# Patient Record
Sex: Male | Born: 1950 | Race: White | Hispanic: No | Marital: Married | Smoking: Former smoker
Health system: Southern US, Community
[De-identification: ages and names within clinical notes are randomized; demographics above are authoritative.]

## PROBLEM LIST (undated history)

## (undated) DIAGNOSIS — E785 Hyperlipidemia, unspecified: Secondary | ICD-10-CM

## (undated) DIAGNOSIS — I69319 Unspecified symptoms and signs involving cognitive functions following cerebral infarction: Secondary | ICD-10-CM

## (undated) DIAGNOSIS — I10 Essential (primary) hypertension: Secondary | ICD-10-CM

## (undated) HISTORY — DX: Unspecified symptoms and signs involving cognitive functions following cerebral infarction: I69.319

## (undated) HISTORY — DX: Hyperlipidemia, unspecified: E78.5

## (undated) HISTORY — DX: Essential (primary) hypertension: I10

---

## 2020-03-27 DIAGNOSIS — R413 Other amnesia: Secondary | ICD-10-CM | POA: Insufficient documentation

## 2020-08-23 DIAGNOSIS — E785 Hyperlipidemia, unspecified: Secondary | ICD-10-CM | POA: Insufficient documentation

## 2020-08-23 DIAGNOSIS — I480 Paroxysmal atrial fibrillation: Secondary | ICD-10-CM | POA: Insufficient documentation

## 2020-08-23 HISTORY — DX: Paroxysmal atrial fibrillation: I48.0

## 2021-02-28 ENCOUNTER — Ambulatory Visit (INDEPENDENT_AMBULATORY_CARE_PROVIDER_SITE_OTHER): Payer: Medicare Other | Admitting: Nurse Practitioner

## 2021-02-28 ENCOUNTER — Other Ambulatory Visit: Payer: Self-pay

## 2021-02-28 ENCOUNTER — Encounter (HOSPITAL_BASED_OUTPATIENT_CLINIC_OR_DEPARTMENT_OTHER): Payer: Self-pay | Admitting: Nurse Practitioner

## 2021-02-28 VITALS — BP 135/88 | HR 77 | Ht 71.0 in | Wt 173.1 lb

## 2021-02-28 DIAGNOSIS — R413 Other amnesia: Secondary | ICD-10-CM | POA: Diagnosis not present

## 2021-02-28 DIAGNOSIS — E785 Hyperlipidemia, unspecified: Secondary | ICD-10-CM | POA: Diagnosis not present

## 2021-02-28 DIAGNOSIS — Z8673 Personal history of transient ischemic attack (TIA), and cerebral infarction without residual deficits: Secondary | ICD-10-CM | POA: Insufficient documentation

## 2021-02-28 DIAGNOSIS — E559 Vitamin D deficiency, unspecified: Secondary | ICD-10-CM | POA: Insufficient documentation

## 2021-02-28 DIAGNOSIS — I480 Paroxysmal atrial fibrillation: Secondary | ICD-10-CM

## 2021-02-28 DIAGNOSIS — Q2112 Patent foramen ovale: Secondary | ICD-10-CM

## 2021-02-28 HISTORY — DX: Personal history of transient ischemic attack (TIA), and cerebral infarction without residual deficits: Z86.73

## 2021-02-28 HISTORY — DX: Patent foramen ovale: Q21.12

## 2021-02-28 NOTE — Patient Instructions (Addendum)
Thank you for choosing [place] [facility] for your Primary Care needs. I am excited for the opportunity to partner with you to meet your health care goals. It was a pleasure meeting you today!  Recommendations from today's visit: I would like you to check your blood pressures at home at a time when you are calm and relaxed. If your BP is higher than 150/100, please let me know.  I have sent the referral to cardiology and neurology for you. If you begin to develop any concerning symptoms or questions prior to seeing them, please let us know or seek emergency evaluation if it is significant or emergent.    Information on diet, exercise, and health maintenance recommendations are listed below. This is information to help you be sure you are on track for optimal health and monitoring.   Please look over this and let us know if you have any questions or if you have completed any of the health maintenance outside of [place] so that we can be sure your records are up to date.  ___________________________________________________________ About Me: I am an Adult-Geriatric Nurse Practitioner with a background in caring for patients for more than 20 years with a strong intensive care background. I provide primary care and sports medicine services to patients age 69 and older within this office. My education had a strong focus on caring for the older adult population, which I am passionate about. I am also the director of the APP Fellowship with [facility].   My desire is to provide you with the best service through preventive medicine and supportive care. I consider you a part of the medical team and value your input. I work diligently to ensure that you are heard and your needs are met in a safe and effective manner. I want you to feel comfortable with me as your provider and want you to know that your health concerns are important to me.  For your information, our office hours  are: Monday, Tuesday, and Thursday 8:00 AM - 5:00 PM Wednesday and Friday 8:00 AM - 12:00 PM.   In my time away from the office I am teaching new APP's within the system and am unavailable, but my partner, Dr. Burnard Bunting is in the office for emergent needs.   If you have questions or concerns, please call our office at [phone] or send [place] a MyChart message and we will respond as quickly as possible.  ____________________________________________________________ MyChart:  For all urgent or time sensitive needs we ask that you please call the office to avoid delays. Our number is [phone]. MyChart is not constantly monitored and due to the large volume of messages a day, replies may take up to 72 business hours.  MyChart Policy: MyChart allows for you to see your visit notes, after visit summary, provider recommendations, lab and tests results, make an appointment, request refills, and contact your provider or the office for non-urgent questions or concerns. Providers are seeing patients during normal business hours and do not have built in time to review MyChart messages.  We ask that you allow a minimum of 3 business days for responses to [employer]. For this reason, please do not send urgent requests through [place]. Please call the office at [phone]. New and ongoing conditions may require a visit. We have virtual and in person visit available for your convenience.  Complex MyChart concerns may require a visit. Your provider may request you schedule a virtual or in  person visit to ensure we are providing the best care possible. MyChart messages sent after 11:00 AM on Friday will not be received by the provider until Monday morning.    Lab and Test Results: You will receive your lab and test results on MyChart as soon as they are completed and results have been sent by the lab or testing facility. Due to this service, you will receive your results BEFORE your provider.  I review  lab and tests results each morning prior to seeing patients. Some results require collaboration with other providers to ensure you are receiving the most appropriate care. For this reason, we ask that you please allow a minimum of 3-5 business days from the time the ALL results have been received for your provider to receive and review lab and test results and contact you about these.  Most lab and test result comments from the provider will be sent through [place]. Your provider may recommend changes to the plan of care, follow-up visits, repeat testing, ask questions, or request an office visit to discuss these results. You may reply directly to this message or call the office at [phone] to provide information for the provider or set up an appointment. In some instances, you will be called with test results and recommendations. Please let us know if this is preferred and we will make note of this in your chart to provide this for you.    If you have not heard a response to your lab or test results in 5 business days from all results returning to [place], please call the office to let us know. We ask that you please avoid calling prior to this time unless there is an emergent concern. Due to high call volumes, this can delay the resulting process.  After Hours: For all non-emergency after hours needs, please call the office at [phone] and select the option to reach the on-call provider service. On-call services are shared between multiple [place] offices and therefore it will not be possible to speak directly with your provider. On-call providers may provide medical advice and recommendations, but are unable to provide refills for maintenance medications.  For all emergency or urgent medical needs after normal business hours, we recommend that you seek care at the closest Urgent Care or Emergency Department to ensure appropriate treatment in a timely manner.  [facility]  has a 24 hour emergency room located on the ground floor for your convenience.   Urgent Concerns During the Business Day Providers are seeing patients from 8AM to [place] with a busy schedule and are most often not able to respond to non-urgent calls until the end of the day or the next business day. If you should have URGENT concerns during the day, please call and speak to the nurse or schedule a same day appointment so that we can address your concern without delay.   Thank you, again, for choosing me as your health care partner. I appreciate your trust and look forward to learning more about you.   Worthy Keeler, DNP, AGNP-c ___________________________________________________________  Health Maintenance Recommendations Screening Testing Mammogram Every 1 -2 years based on history and risk factors Starting at age 51 Pap Smear Ages 21-39 every 3 years Ages 21-65 every 5 years with HPV testing More frequent testing may be required based on results and history Colon Cancer Screening Every 1-10 years based on test performed, risk factors, and history Starting at age 67 Bone Density Screening Every 2-10 years  based on history Starting at age 7 for women Recommendations for men differ based on medication usage, history, and risk factors AAA Screening One time ultrasound Men 55-20 years old who have every smoked Lung Cancer Screening Low Dose Lung CT every 12 months Age 77-80 years with a 30 pack-year smoking history who still smoke or who have quit within the last 15 years  Screening Labs Routine  Labs: Complete Blood Count (CBC), Complete Metabolic Panel (CMP), Cholesterol (Lipid Panel) Every 6-12 months based on history and medications May be recommended more frequently based on current conditions or previous results Hemoglobin A1c Lab Every 3-12 months based on history and previous results Starting at age 106 or earlier with diagnosis of diabetes, high cholesterol, BMI >26,  and/or risk factors Frequent monitoring for patients with diabetes to ensure blood sugar control Thyroid Panel (TSH w/ T3 & T4) Every 6 months based on history, symptoms, and risk factors May be repeated more often if on medication HIV One time testing for all patients 7 and older May be repeated more frequently for patients with increased risk factors or exposure Hepatitis C One time testing for all patients 71 and older May be repeated more frequently for patients with increased risk factors or exposure Gonorrhea, Chlamydia Every 12 months for all sexually active persons 13-24 years Additional monitoring may be recommended for those who are considered high risk or who have symptoms PSA Men 44-42 years old with risk factors Additional screening may be recommended from age 72-69 based on risk factors, symptoms, and history  Vaccine Recommendations Tetanus Booster All adults every 10 years Flu Vaccine All patients 6 months and older every year COVID Vaccine All patients 12 years and older Initial dosing with booster May recommend additional booster based on age and health history HPV Vaccine 2 doses all patients age 1-26 Dosing may be considered for patients over 26 Shingles Vaccine (Shingrix) 2 doses all adults 84 years and older Pneumonia (Pneumovax 23) All adults 45 years and older May recommend earlier dosing based on health history Pneumonia (Prevnar 78) All adults 78 years and older Dosed 1 year after Pneumovax 23  Additional Screening, Testing, and Vaccinations may be recommended on an individualized basis based on family history, health history, risk factors, and/or exposure.  __________________________________________________________  Diet Recommendations for All Patients  I recommend that all patients maintain a diet low in saturated fats, carbohydrates, and cholesterol. While this can be challenging at first, it is not impossible and small changes can make big  differences.  Things to try: Decreasing the amount of soda, sweet tea, and/or juice to one or less per day and replace with water While water is always the first choice, if you do not like water you may consider adding a water additive without sugar to improve the taste other sugar free drinks Replace potatoes with a brightly colored vegetable at dinner Use healthy oils, such as canola oil or olive oil, instead of butter or hard margarine Limit your bread intake to two pieces or less a day Replace regular pasta with low carb pasta options Bake, broil, or grill foods instead of frying Monitor portion sizes  Eat smaller, more frequent meals throughout the day instead of large meals  An important thing to remember is, if you love foods that are not great for your health, you don't have to give them up completely. Instead, allow these foods to be a reward when you have done well. Allowing yourself to still have special treats  every once in a while is a nice way to tell yourself thank you for working hard to keep yourself healthy.   Also remember that every day is a new day. If you have a bad day and "fall off the wagon", you can still climb right back up and keep moving along on your journey!  We have resources available to help you!  Some websites that may be helpful include: www.http://carter.biz/  Www.VeryWellFit.com _____________________________________________________________  Activity Recommendations for All Patients  I recommend that all adults get at least 20 minutes of moderate physical activity that elevates your heart rate at least 5 days out of the week.  Some examples include: Walking or jogging at a pace that allows you to carry on a conversation Cycling (stationary bike or outdoors) Water aerobics Yoga Weight lifting Dancing If physical limitations prevent you from putting stress on your joints, exercise in a pool or seated in a chair are excellent options.  Do determine your  MAXIMUM heart rate for activity: YOUR AGE - 220 = MAX HeartRate   Remember! Do not push yourself too hard.  Start slowly and build up your pace, speed, weight, time in exercise, etc.  Allow your body to rest between exercise and get good sleep. You will need more water than normal when you are exerting yourself. Do not wait until you are thirsty to drink. Drink with a purpose of getting in at least 8, 8 ounce glasses of water a day plus more depending on how much you exercise and sweat.    If you begin to develop dizziness, chest pain, abdominal pain, jaw pain, shortness of breath, headache, vision changes, lightheadedness, or other concerning symptoms, stop the activity and allow your body to rest. If your symptoms are severe, seek emergency evaluation immediately. If your symptoms are concerning, but not severe, please let us know so that we can recommend further evaluation.

## 2021-02-28 NOTE — Progress Notes (Signed)
Tollie Eth, DNP, AGNP-c Primary Care & Sports Medicine [address]   [address] [place], [place] [address] [phone]  New patient visit   Patient: Steven Oliver   DOB: 1950/02/01   70 y.o. Male  MRN: [id number] Visit Date: 02/28/2021  Patient Care Team: Konstantina Nachreiner, Sung Amabile, NP as PCP - General (Nurse Practitioner)  Today's healthcare provider: Tollie Eth, NP   Chief Complaint  Patient presents with   New Patient (Initial Visit)    Patient presents today to establish care. He is new to area, moved here 2 months ago. History of strokes. He would like labs done today. A referral to Neurologist and Cardiologist. No refills needed today.    Subjective    Steven Oliver is a 71 y.o. male who presents today as a new patient to establish care.    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 They found at that time that he had experienced several TIAs prior.  He has no residual weakness however is experiencing mental status changes including short-term memory loss.  He and his wife do express concern today that there may be an element of dementia present.  These changes [place] be closer to family for help.  They have been here approximately 2 months.  He would like a referral to Dr. Everlena Cooper with [facility] neurology today.  Patient's wife reports that he has a history of whitecoat hypertension.  His blood pressures run at home in the 120s over 90s most days.  He denies any chest pain, palpitations, dizziness, weakness.  He has no lower extremity edema.  He was followed by cardiology at his previous home and would like a referral today.  They report he does have a history of PFO, A-fib, and hyperlipidemia.  He is chronically anticoagulated and denies any current signs of bleeding or easy bruising.  He currently lives at home with his wife.  He has 2 adult  sons.  He reports he is safe in his current environment and relationship.  He endorses wine socially, he has never used recreational drugs, he has approximately 1 cigarette a week.   History reviewed and reveals the following: Past Medical History:  Diagnosis Date   CVA, old, cognitive deficits    Hyperlipidemia    Hypertension    History reviewed. No pertinent surgical history. Family Status  Relation Name Status   Mother  (Not Specified)   Mat Aunt  (Not Specified)   Family History  Problem Relation Age of Onset   Alzheimer's disease Mother    Alzheimer's disease Maternal Aunt    Social History   Socioeconomic History   Marital status: Married    Spouse name: [name]   Number of children: 2   Years of education: 12   Highest education level: Not on file  Occupational History   Not on file  Tobacco Use   Smoking status: Some Days    Types: Cigarettes   Smokeless tobacco: Never  Vaping Use   Vaping Use: Never used  Substance and Sexual Activity   Alcohol use: Yes    Alcohol/week: 1.0 standard drink    Types: 1 Glasses of wine per week   Drug use: Never   Sexual activity: Not Currently    Partners: Female  Other Topics Concern   Not on file  Social History Narrative   Not  on file   Social Determinants of Health   Financial Resource Strain: Not on file  Food Insecurity: Not on file  Transportation Needs: Not on file  Physical Activity: Not on file  Stress: Not on file  Social Connections: Not on file   Outpatient Medications Prior to Visit  Medication Sig   apixaban (ELIQUIS) 5 MG TABS tablet Take 1 tablet by mouth 2 (two) times daily.   atorvastatin (LIPITOR) 40 MG tablet Take 40 mg by mouth daily.   cholecalciferol (VITAMIN D) 25 MCG (1000 UNIT) tablet Take by mouth.   Multiple Vitamins-Minerals (CENTRUM SILVER PO) Take by mouth.   XIIDRA 5 % SOLN Apply 1 drop to eye 2 (two) times daily.   No facility-administered medications prior to visit.   Not on  File Immunization History  Administered Date(s) Administered   Influenza-Unspecified 09/13/2020    Health Maintenance Due: Health Maintenance  Topic Date Due   COVID-19 Vaccine (1) Never done   Pneumonia Vaccine 35+ Years old (1 - PCV) Never done   Hepatitis C Screening  Never done   TETANUS/TDAP  Never done   COLONOSCOPY (Pts 45-47yrs Insurance coverage will need to be confirmed)  Never done   Zoster Vaccines- Shingrix (1 of 2) Never done   INFLUENZA VACCINE  Completed   HPV VACCINES  Aged Out    Review of Systems All review of systems negative except what is listed in the HPI   Objective    BP 135/88    Pulse 77    Ht 5\' 11"  (1.803 m)    Wt 173 lb 1.6 oz (78.5 kg)    SpO2 95%    BMI 24.14 kg/m  Physical Exam Vitals and nursing note reviewed.  Constitutional:      Appearance: Normal appearance.  HENT:     Head: Normocephalic.  Eyes:     Extraocular Movements: Extraocular movements intact.     Conjunctiva/sclera: Conjunctivae normal.     Pupils: Pupils are equal, round, and reactive to light.  Neck:     Vascular: No carotid bruit.  Cardiovascular:     Rate and Rhythm: Normal rate and regular rhythm.     Pulses: Normal pulses.     Heart sounds: Murmur heard.  Pulmonary:     Effort: Pulmonary effort is normal.     Breath sounds: Normal breath sounds. No wheezing.  Abdominal:     General: Abdomen is flat. Bowel sounds are normal. There is no distension.     Palpations: Abdomen is soft.     Tenderness: There is no abdominal tenderness. There is no right CVA tenderness, left CVA tenderness or guarding.  Musculoskeletal:        General: Normal range of motion.     Cervical back: Normal range of motion.     Right lower leg: No edema.     Left lower leg: No edema.  Lymphadenopathy:     Cervical: No cervical adenopathy.  Skin:    General: Skin is warm and dry.     Capillary Refill: Capillary refill takes less than 2 seconds.  Neurological:     General: No focal  deficit present.     Mental Status: He is alert and oriented to person, place, and time.     Cranial Nerves: No cranial nerve deficit.     Sensory: No sensory deficit.     Motor: No weakness.     Gait: Gait normal.     Deep Tendon Reflexes: Reflexes normal.  Psychiatric:        Mood and Affect: Mood normal.        Behavior: Behavior normal.        Thought Content: Thought content normal.        Judgment: Judgment normal.    No results found for any visits on 02/28/21.  Assessment & Plan      Problem List Items Addressed This Visit     Dyslipidemia - Primary    Chronic.  Currently taking atorvastatin 40 mg once a day. We will obtain labs today for evaluation and make recommendations to changes to plan of care as necessary based on findings.      Relevant Medications   atorvastatin (LIPITOR) 40 MG tablet   Other Relevant Orders   Ambulatory referral to Cardiology   CBC with Differential/Platelet   Comprehensive metabolic panel   Lipid panel   H/O: stroke    History of embolic stroke with short-term memory deficits present.  Next time no weakness or alarm symptoms present today.  Patient is chronically anticoagulated on Eliquis 5 mg twice a day. History of A-fib and PFO likely contributing to CVA history. We will send referral to neurology today for recommendations and monitoring.      Relevant Orders   Ambulatory referral to Cardiology   CBC with Differential/Platelet   Comprehensive metabolic panel   Lipid panel   Ambulatory referral to Neurology   Memory problem    Short-term memory difficulties following CVA. Unclear at this time if additional factors such as mild dementia are also presenting.  Patient's wife is concerned that this could be the case. Recommend evaluation with neurology and memory testing for further evaluation. We will continue to follow.      Relevant Orders   CBC with Differential/Platelet   Comprehensive metabolic panel   Lipid panel    VITAMIN D 25 Hydroxy (Vit-D Deficiency, Fractures)   B12 and Folate Panel   Ambulatory referral to Neurology   PAF (paroxysmal atrial fibrillation) (HCC)    Heart rate and rhythm regular on evaluation today. Patient is currently chronically anticoagulated with Eliquis 5 mg twice a day No alarm symptoms present at this time.  Will collaborate with cardiology.      Relevant Medications   apixaban (ELIQUIS) 5 MG TABS tablet   atorvastatin (LIPITOR) 40 MG tablet   Other Relevant Orders   Ambulatory referral to Cardiology   CBC with Differential/Platelet   Comprehensive metabolic panel   Lipid panel   PFO (patent foramen ovale)    PFO noted with history of paroxysmal A-fib and previous stroke. Patient is chronically anticoagulated on Eliquis 5 mg twice daily. Murmur is detected on auscultation today. No alarm symptoms present at this time. We will send referral to cardiology for further evaluation and recommendations.      Relevant Medications   apixaban (ELIQUIS) 5 MG TABS tablet   atorvastatin (LIPITOR) 40 MG tablet   Other Relevant Orders   Ambulatory referral to Cardiology   CBC with Differential/Platelet   Comprehensive metabolic panel   Lipid panel   Vitamin D deficiency    History of vitamin D deficiency.  Patient is currently taking over-the-counter supplementation. We will obtain labs today for further evaluation.      Relevant Orders   VITAMIN D 25 Hydroxy (Vit-D Deficiency, Fractures)     Return in about 3 months (around 05/28/2021) for F/U HTN.     Ilya Neely, Sung AmabileSara E, NP, DNP, AGNP-C Primary Care & Sports Medicine  at [facility]

## 2021-03-01 ENCOUNTER — Encounter (HOSPITAL_BASED_OUTPATIENT_CLINIC_OR_DEPARTMENT_OTHER): Payer: Self-pay | Admitting: Nurse Practitioner

## 2021-03-01 NOTE — Assessment & Plan Note (Signed)
PFO noted with history of paroxysmal A-fib and previous stroke. Patient is chronically anticoagulated on Eliquis 5 mg twice daily. Murmur is detected on auscultation today. No alarm symptoms present at this time. We will send referral to cardiology for further evaluation and recommendations.

## 2021-03-01 NOTE — Assessment & Plan Note (Signed)
History of vitamin D deficiency.  Patient is currently taking over-the-counter supplementation. We will obtain labs today for further evaluation.

## 2021-03-01 NOTE — Assessment & Plan Note (Signed)
Chronic.  Currently taking atorvastatin 40 mg once a day. We will obtain labs today for evaluation and make recommendations to changes to plan of care as necessary based on findings.

## 2021-03-01 NOTE — Assessment & Plan Note (Signed)
Short-term memory difficulties following CVA. Unclear at this time if additional factors such as mild dementia are also presenting.  Patient's wife is concerned that this could be the case. Recommend evaluation with neurology and memory testing for further evaluation. We will continue to follow.

## 2021-03-01 NOTE — Assessment & Plan Note (Signed)
Heart rate and rhythm regular on evaluation today. Patient is currently chronically anticoagulated with Eliquis 5 mg twice a day No alarm symptoms present at this time.  Will collaborate with cardiology.

## 2021-03-01 NOTE — Assessment & Plan Note (Signed)
History of embolic stroke with short-term memory deficits present.  Next time no weakness or alarm symptoms present today.  Patient is chronically anticoagulated on Eliquis 5 mg twice a day. History of A-fib and PFO likely contributing to CVA history. We will send referral to neurology today for recommendations and monitoring.

## 2021-03-04 ENCOUNTER — Encounter: Payer: Self-pay | Admitting: Physician Assistant

## 2021-03-04 LAB — COMPREHENSIVE METABOLIC PANEL
ALT: 13 IU/L (ref 0–44)
AST: 13 IU/L (ref 0–40)
Albumin/Globulin Ratio: 1.9 (ref 1.2–2.2)
Albumin: 4.7 g/dL (ref 3.8–4.8)
Alkaline Phosphatase: 92 IU/L (ref 44–121)
BUN/Creatinine Ratio: 15 (ref 10–24)
BUN: 15 mg/dL (ref 8–27)
Bilirubin Total: 0.5 mg/dL (ref 0.0–1.2)
CO2: 26 mmol/L (ref 20–29)
Calcium: 10.4 mg/dL — ABNORMAL HIGH (ref 8.6–10.2)
Chloride: 101 mmol/L (ref 96–106)
Creatinine, Ser: 1.02 mg/dL (ref 0.76–1.27)
Globulin, Total: 2.5 g/dL (ref 1.5–4.5)
Glucose: 102 mg/dL — ABNORMAL HIGH (ref 70–99)
Potassium: 4.5 mmol/L (ref 3.5–5.2)
Sodium: 141 mmol/L (ref 134–144)
Total Protein: 7.2 g/dL (ref 6.0–8.5)
eGFR: 79 mL/min/{1.73_m2} (ref >59)

## 2021-03-04 LAB — LIPID PANEL
Chol/HDL Ratio: 2.6 ratio (ref 0.0–5.0)
Cholesterol, Total: 167 mg/dL (ref 100–199)
HDL: 65 mg/dL (ref >39)
LDL Chol Calc (NIH): 86 mg/dL (ref 0–99)
Triglycerides: 84 mg/dL (ref 0–149)
VLDL Cholesterol Cal: 16 mg/dL (ref 5–40)

## 2021-03-04 LAB — B12 AND FOLATE PANEL
Folate: 9.5 ng/mL (ref >3.0)
Vitamin B-12: 556 pg/mL (ref 232–1245)

## 2021-03-04 LAB — CBC WITH DIFFERENTIAL/PLATELET
Basophils Absolute: 0.1 10*3/uL (ref 0.0–0.2)
Basos: 1 %
EOS (ABSOLUTE): 0.2 10*3/uL (ref 0.0–0.4)
Eos: 2 %
Hematocrit: 45.8 % (ref 37.5–51.0)
Hemoglobin: 15 g/dL (ref 13.0–17.7)
Immature Grans (Abs): 0 10*3/uL (ref 0.0–0.1)
Immature Granulocytes: 0 %
Lymphocytes Absolute: 1.6 10*3/uL (ref 0.7–3.1)
Lymphs: 20 %
MCH: 29.8 pg (ref 26.6–33.0)
MCHC: 32.8 g/dL (ref 31.5–35.7)
MCV: 91 fL (ref 79–97)
Monocytes Absolute: 0.7 10*3/uL (ref 0.1–0.9)
Monocytes: 9 %
Neutrophils Absolute: 5.8 10*3/uL (ref 1.4–7.0)
Neutrophils: 68 %
Platelets: 249 10*3/uL (ref 150–450)
RBC: 5.03 x10E6/uL (ref 4.14–5.80)
RDW: 12.8 % (ref 11.6–15.4)
WBC: 8.4 10*3/uL (ref 3.4–10.8)

## 2021-03-04 LAB — VITAMIN D 25 HYDROXY (VIT D DEFICIENCY, FRACTURES): Vit D, 25-Hydroxy: 32.5 ng/mL (ref 30.0–100.0)

## 2021-03-11 NOTE — Progress Notes (Signed)
Assessment/Plan:   Steven Oliver is a very pleasant 71 y.o. year old RH male with  a history of "white coat "hypertension, hyperlipidemia, remote CVA, atrial fibrillation on Eliquis , history of PFO seen today for evaluation of memory loss.  And to establish care as the patient recently moved from [facility].  MoCA today is 4/30 with deficiencies in most area.  Over the last year, daily dementia has been progressing at a more accelerated rate, with significant cognitive decline.  In the past, he had been seen first in [place], reportedly it was felt that his cognitive issues were due to the stroke.  Once moving to [facility], he had initial evaluation at a different facility, was to undergo neuropsychological evaluation, but wife wanted to relocate to the [place] area, to be near her family, to be able to care for him with the help of the close ones.    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  CBC, CMP, TSH, B12, RPR, ammonia, ESR, CRP, ANA, HIV MRI brain with/without contrast to assess for underlying structural abnormality and assess vascular load  LP for CSF studies, including 14 3, as well as total protein Neurocognitive testing to evaluate cognitive concerns and determine other underlying causes of memory changes, including potential contribution from sleep, anxiety or depression. Check TSH, B12, CBC, CMP, RPR, ammonia, sed rate, CRP, ANA and HIV Discussed safety both in and out of the home.  Discussed the importance of regular daily schedule to maintain brain function.  Continue to monitor mood with PCP.  Stay active at least 30 minutes at least 3 times a week.  Naps should be scheduled and should be no longer than 60 minutes and should not occur after 2 PM.   Start Memantine 10 mg: Take 1 tablet (10 mg at night) for 2 weeks, then increase to 1 tablet (10 mg) twice a day   Control  cardiovascular risk factors  Mediterranean diet is recommended  Folllow up in 2 months and after neurocognitve testing    Subjective:   The patient is seen in neurologic consultation at the request of Early, Coralee Pesa, NP for the evaluation of memory.  The patient is accompanied by his wife who supplements the history.a pleasant 71 year old man who recently relocated to this area to establish care in view of the diagnosis of dementia.   About 3 years ago, the patient suffered a CVA while in [place], found at the time that he had experienced several TIAs prior, without residual weaknessIitially he was "doing well 1 year after his CVA, except for some issues with his short-term memory, then beginning to show decline ".  In review, back in March of last year, his wife said he did not recognize her "several episodes like that"  "Most of the time he is fairly okay, but they are increasing moments of disorientation ".Sometimes he does not recognize the house that he is in, thinking he is [place] the [place] in [place] prior home.  He is also unable to do some home repairs that he used to do before. 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  Sometimes he asked his wife "where is my wife?  ".His confusion may last most of the day.  Recently, he was going to take his dog for a walk, but he only took the leash with him, "and forgot the dog ".  He also does not know how to use the fork properly anymore.  There are some hygiene concerns, such as not wishing to take a shower.  He asked for "can I take it another time? ".The morning of this visit, he was needing help to dress up, he forgot how to put the belt on. He leaves objects in different places, for example not realizing after eating ice cream to put it back in the freezer, and instead only putting the lid. He ambulates without difficulty, denies any recent falls or head injuries. He sleeps fairly well unless he has to get up to go  to the bathroom.  His wife is in charge of the medications, as well as finances (she was a Customer service manager before).  He currently denies any headaches, double vision, dizziness, focal numbness or tingling, unilateral weakness, tremors or anosmia or seizures.  Denies urine incontinence, retention, constipation or diarrhea.  Denies sleep apnea, alcohol or tobacco.  Family history significant for mother with Alzheimer's disease, he has sisters and uncles with dementia.headaches, double vision, dizziness, focal numbness or tingling, unilateral weakness, tremors or anosmia. No history of seizures. Denies urine incontinence, retention, constipation or diarrhea.  Denies OSA, ETOH or Tobacco.       No Known Allergies  Current Outpatient Medications  Medication Instructions   apixaban (ELIQUIS) 5 MG TABS tablet 1 tablet, Oral, 2 times daily   atorvastatin (LIPITOR) 40 mg, Oral, Daily   cholecalciferol (VITAMIN D) 25 MCG (1000 UNIT) tablet Oral   memantine (NAMENDA) 10 mg, Oral, 2 times daily   Multiple Vitamins-Minerals (CENTRUM SILVER PO) Oral   XIIDRA 5 % SOLN 1 drop, Ophthalmic, 2 times daily     VITALS:   Vitals:   03/12/21 1318  BP: (!) 153/91  Pulse: 96  Resp: 18  SpO2: 97%  Weight: 171 lb (77.6 kg)  Height: 5' 11" (1.803 m)   Depression screen [facility] 2/9 03/01/2021  Decreased Interest 1  Down, Depressed, Hopeless 1  PHQ - 2 Score 2  Altered sleeping 0  Tired, decreased energy 0  Change in appetite 1  Feeling bad or failure about yourself  2  Trouble concentrating 1  Moving slowly or fidgety/restless 1  Suicidal thoughts 0  PHQ-9 Score 7  Difficult doing work/chores Somewhat difficult    PHYSICAL EXAM   HEENT:  Normocephalic, atraumatic. The mucous membranes are moist. The superficial temporal arteries are without ropiness or tenderness. Cardiovascular: Regular rate and rhythm. Lungs: Clear to auscultation bilaterally. Neck: There are no carotid bruits noted  bilaterally.  NEUROLOGICAL: Montreal Cognitive Assessment  03/13/2021  Visuospatial/ Executive (0/5) 0  Naming (0/3) 1  Attention: Read list of digits (0/2) 2  Attention: Read list of letters (0/1) 0  Attention: Serial 7 subtraction starting at 100 (0/3) 0  Language: Repeat phrase (0/2) 0  Language : Fluency (0/1) 0  Abstraction (0/2) 0  Delayed Recall (0/5) 0  Orientation (0/6) 1  Total 4  Adjusted Score (based on education) 4   No flowsheet data found.  No flowsheet data found.   Orientation:  Alert and oriented to person, not to place or time. No aphasia or dysarthria. Fund of knowledge is reduced. Recent and remote memory are reduced .  Attention and concentration are reduced  Able to name objects 1/3  and repeat phrases 0 . Delayed recall  0/5  Cranial nerves: There is good facial symmetry. Extraocular muscles are intact and visual fields are full to confrontational testing. Speech is fluent  and clear. Soft palate rises symmetrically and there is no tongue deviation. Hearing is intact to conversational tone. Tone: Tone is good throughout. Sensation: Sensation is intact to light touch and pinprick throughout. Vibration is intact at the bilateral big toe.There is no extinction with double simultaneous stimulation. There is no sensory dermatomal level identified. Coordination: The patient has no difficulty with RAM's or FNF bilaterally. Normal finger to nose  Motor: Strength is 5/5 in the bilateral upper and lower extremities. There is no pronator drift. There are no fasciculations noted. DTR's: Deep tendon reflexes are 2/4 at the bilateral biceps, triceps, brachioradialis, patella and achilles.  Plantar responses are downgoing bilaterally. Gait and Station: The patient is able to ambulate without difficulty.The patient is able to heel toe walk without any difficulty.The patient is able to ambulate in a tandem fashion. The patient is able to stand in the Romberg position.     Thank you  for allowing [place] the opportunity to participate in the care of this nice patient. Please do not hesitate to contact us for any questions or concerns.   Total time spent on today's visit was 60 minutes, including both face-to-face time and nonface-to-face time.  Time included that spent on review of records (prior notes available to me/labs/imaging if pertinent), discussing treatment and goals, answering patient's questions and coordinating care.  Cc:  Orma Render, NP  Sharene Butters 03/13/2021 9:05 PM

## 2021-03-12 ENCOUNTER — Other Ambulatory Visit: Payer: Self-pay

## 2021-03-12 ENCOUNTER — Ambulatory Visit (INDEPENDENT_AMBULATORY_CARE_PROVIDER_SITE_OTHER): Payer: Medicare Other | Admitting: Physician Assistant

## 2021-03-12 ENCOUNTER — Encounter: Payer: Self-pay | Admitting: Physician Assistant

## 2021-03-12 VITALS — BP 153/91 | HR 96 | Resp 18 | Ht 71.0 in | Wt 171.0 lb

## 2021-03-12 DIAGNOSIS — R413 Other amnesia: Secondary | ICD-10-CM

## 2021-03-12 DIAGNOSIS — F01C0 Vascular dementia, severe, without behavioral disturbance, psychotic disturbance, mood disturbance, and anxiety: Secondary | ICD-10-CM | POA: Diagnosis not present

## 2021-03-12 MED ORDER — MEMANTINE HCL 10 MG PO TABS: 10.0000 mg | ORAL_TABLET | Freq: Two times a day (BID) | ORAL | 11 refills | Status: DC

## 2021-03-12 NOTE — Patient Instructions (Addendum)
It was a pleasure to see you today at our office.   Recommendations:  Meds: Follow up in 2 months MRI brain with and without contrast Bloodwork  today Lumbar puncture  Neurocongintive exam  Start Memantine 10mg  tablets.  Take 1 tablet at bedtime for 2 weeks, then 1 tablet twice daily.   Side effects include dizziness, headache, diarrhea or constipation.  Call with any questions or concerns.    RECOMMENDATIONS FOR ALL PATIENTS WITH MEMORY PROBLEMS: 1. Continue to exercise (Recommend 30 minutes of walking everyday, or 3 hours every week) 2. Increase social interactions - continue going to [place] and enjoy social gatherings with friends and family 3. Eat healthy, avoid fried foods and eat more fruits and vegetables 4. Maintain adequate blood pressure, blood sugar, and blood cholesterol level. Reducing the risk of stroke and cardiovascular disease also helps promoting better memory. 5. Avoid stressful situations. Live a simple life and avoid aggravations. Organize your time and prepare for the next day in anticipation. 6. Sleep well, avoid any interruptions of sleep and avoid any distractions in the bedroom that may interfere with adequate sleep quality 7. Avoid sugar, avoid sweets as there is a strong link between excessive sugar intake, diabetes, and cognitive impairment We discussed the Mediterranean diet, which has been shown to help patients reduce the risk of progressive memory disorders and reduces cardiovascular risk. This includes eating fish, eat fruits and green leafy vegetables, nuts like almonds and hazelnuts, walnuts, and also use olive oil. Avoid fast foods and fried foods as much as possible. Avoid sweets and sugar as sugar use has been linked to worsening of memory function.  There is always a concern of gradual progression of memory problems. If this is the case, then we may need to adjust level of care according to patient needs. Support, both to the patient and caregiver,  should then be put into place.    The Alzheimers Association is here all day, every day for people facing Alzheimers disease through our free 24/7 Helpline: [phone]. The Helpline provides reliable information and support to all those who need assistance, such as individuals living with memory loss, Alzheimer's or other dementia, caregivers, health care professionals and the public.  Our highly trained and knowledgeable staff can help you with: Understanding memory loss, dementia and Alzheimer's  Medications and other treatment options  General information about aging and brain health  Skills to provide quality care and to find the best care from professionals  Legal, financial and living-arrangement decisions Our Helpline also features: Confidential care consultation provided by master's level clinicians who can help with decision-making support, crisis assistance and education on issues families face every day  Help in a caller's preferred language using our translation service that features more than 200 languages and dialects  Referrals to local community programs, services and ongoing support     FALL PRECAUTIONS: Be cautious when walking. Scan the area for obstacles that may increase the risk of trips and falls. When getting up in the mornings, sit up at the edge of the bed for a few minutes before getting out of bed. Consider elevating the bed at the head end to avoid drop of blood pressure when getting up. Walk always in a well-lit room (use night lights in the walls). Avoid area rugs or power cords from appliances in the middle of the walkways. Use a walker or a cane if necessary and consider physical therapy for balance exercise. Get your eyesight checked regularly.  FINANCIAL OVERSIGHT:  Supervision, especially oversight when making financial decisions or transactions is also recommended.  HOME SAFETY: Consider the safety of the kitchen when operating appliances like stoves,  microwave oven, and blender. Consider having supervision and share cooking responsibilities until no longer able to participate in those. Accidents with firearms and other hazards in the house should be identified and addressed as well.   ABILITY TO BE LEFT ALONE: If patient is unable to contact 911 operator, consider using LifeLine, or when the need is there, arrange for someone to stay with patients. Smoking is a fire hazard, consider supervision or cessation. Risk of wandering should be assessed by caregiver and if detected at any point, supervision and safe proof recommendations should be instituted.  MEDICATION SUPERVISION: Inability to self-administer medication needs to be constantly addressed. Implement a mechanism to ensure safe administration of the medications.   DRIVING: Regarding driving, in patients with progressive memory problems, driving will be impaired. We advise to have someone else do the driving if trouble finding directions or if minor accidents are reported. Independent driving assessment is available to determine safety of driving.   If you are interested in the driving assessment, you can contact the following:  [employer] in [place]  [place] [phone] or [phone]      [place] refers to food and lifestyle choices that are based on the traditions of countries located on the [employer]. This way of eating has been shown to help prevent certain conditions and improve outcomes for people who have chronic diseases, like kidney disease and heart disease. What are tips for following this plan? Lifestyle  Cook and eat meals together with your family, when possible. Drink enough fluid to keep your urine clear or pale yellow. Be physically active every day. This includes: Aerobic exercise like running or  swimming. Leisure activities like gardening, walking, or housework. Get 7-8 hours of sleep each night. If recommended by your health care provider, drink red wine in moderation. This means 1 glass a day for nonpregnant women and 2 glasses a day for men. A glass of wine equals 5 oz (150 mL). Reading food labels  Check the serving size of packaged foods. For foods such as rice and pasta, the serving size refers to the amount of cooked product, not dry. Check the total fat in packaged foods. Avoid foods that have saturated fat or trans fats. Check the ingredients list for added sugars, such as corn syrup. Shopping  At the grocery store, buy most of your food from the areas near the walls of the store. This includes: Fresh fruits and vegetables (produce). Grains, beans, nuts, and seeds. Some of these may be available in unpackaged forms or large amounts (in bulk). Fresh seafood. Poultry and eggs. Low-fat dairy products. Buy whole ingredients instead of prepackaged foods. Buy fresh fruits and vegetables in-season from local farmers markets. Buy frozen fruits and vegetables in resealable bags. If you do not have access to quality fresh seafood, buy precooked frozen shrimp or canned fish, such as tuna, salmon, or sardines. Buy small amounts of raw or cooked vegetables, salads, or olives from the deli or salad bar at your store. Stock your pantry so you always have certain foods on hand, such as olive oil, canned tuna, canned tomatoes, rice, pasta, and beans. Cooking  Cook foods with extra-virgin olive oil instead of using butter or other vegetable oils. Have meat  as a side dish, and have vegetables or grains as your main dish. This means having meat in small portions or adding small amounts of meat to foods like pasta or stew. Use beans or vegetables instead of meat in common dishes like chili or lasagna. Experiment with different cooking methods. Try roasting or broiling vegetables instead of  steaming or sauteing them. Add frozen vegetables to soups, stews, pasta, or rice. Add nuts or seeds for added healthy fat at each meal. You can add these to yogurt, salads, or vegetable dishes. Marinate fish or vegetables using olive oil, lemon juice, garlic, and fresh herbs. Meal planning  Plan to eat 1 vegetarian meal one day each week. Try to work up to 2 vegetarian meals, if possible. Eat seafood 2 or more times a week. Have healthy snacks readily available, such as: Vegetable sticks with hummus. Greek yogurt. Fruit and nut trail mix. Eat balanced meals throughout the week. This includes: Fruit: 2-3 servings a day Vegetables: 4-5 servings a day Low-fat dairy: 2 servings a day Fish, poultry, or lean meat: 1 serving a day Beans and legumes: 2 or more servings a week Nuts and seeds: 1-2 servings a day Whole grains: 6-8 servings a day Extra-virgin olive oil: 3-4 servings a day Limit red meat and sweets to only a few servings a month What are my food choices? Mediterranean diet Recommended Grains: Whole-grain pasta. Brown rice. Bulgar wheat. Polenta. Couscous. Whole-wheat bread. [place]. Vegetables: Artichokes. Beets. Broccoli. Cabbage. Carrots. Eggplant. Green beans. Chard. Kale. Spinach. Onions. Leeks. Peas. Squash. Tomatoes. Peppers. Radishes. Fruits: Apples. Apricots. Avocado. Berries. Bananas. Cherries. Dates. Figs. Grapes. Lemons. Melon. Oranges. Peaches. Plums. Pomegranate. Meats and other protein foods: Beans. Almonds. Sunflower seeds. Pine nuts. Peanuts. [place]. Salmon. Scallops. Shrimp. [place]. Tilapia. Clams. Oysters. Eggs. Dairy: Low-fat milk. Cheese. Greek yogurt. Beverages: Water. Red wine. Herbal tea. Fats and oils: Extra virgin olive oil. Avocado oil. Grape seed oil. Sweets and desserts: [place] yogurt with honey. Baked apples. Poached pears. Trail mix. Seasoning and other foods: Basil. Cilantro. Coriander. Cumin. Mint. Parsley. Sage. Rosemary. Tarragon. Garlic.  Oregano. Thyme. Pepper. Balsalmic vinegar. Tahini. Hummus. Tomato sauce. Olives. Mushrooms. Limit these Grains: Prepackaged pasta or rice dishes. Prepackaged cereal with added sugar. Vegetables: Deep fried potatoes (french fries). Fruits: Fruit canned in syrup. Meats and other protein foods: Beef. Pork. Lamb. Poultry with skin. Hot dogs. [place] Salines. Dairy: Ice cream. Sour cream. Whole milk. Beverages: Juice. Sugar-sweetened soft drinks. Beer. Liquor and spirits. Fats and oils: Butter. Canola oil. Vegetable oil. Beef fat (tallow). Lard. Sweets and desserts: Cookies. Cakes. Pies. Candy. Seasoning and other foods: Mayonnaise. Premade sauces and marinades. The items listed may not be a complete list. Talk with your dietitian about what dietary choices are right for you. Summary The Mediterranean diet includes both food and lifestyle choices. Eat a variety of fresh fruits and vegetables, beans, nuts, seeds, and whole grains. Limit the amount of red meat and sweets that you eat. Talk with your health care provider about whether it is safe for you to drink red wine in moderation. This means 1 glass a day for nonpregnant women and 2 glasses a day for men. A glass of wine equals 5 oz (150 mL). This information is not intended to replace advice given to you by your health care provider. Make sure you discuss any questions you have with your health care provider. Document Released: 08/23/2015 Document Revised: 09/25/2015 Document Reviewed: 08/23/2015 Elsevier Interactive Patient Education  2017 [employer].  We have  sent a referral to [facility] for your MRI and they will call you directly to schedule your appointment. They are located at [place]. If you need to contact them directly please call [phone].  Lumbar Puncture will be ordered and they will contact you as well. Thank you

## 2021-03-13 DIAGNOSIS — F01C0 Vascular dementia, severe, without behavioral disturbance, psychotic disturbance, mood disturbance, and anxiety: Secondary | ICD-10-CM | POA: Insufficient documentation

## 2021-03-15 ENCOUNTER — Other Ambulatory Visit: Payer: Self-pay | Admitting: Physician Assistant

## 2021-03-15 ENCOUNTER — Ambulatory Visit
Admission: RE | Admit: 2021-03-15 | Discharge: 2021-03-15 | Disposition: A | Discharge status: Home / Self Care | Payer: Medicare Other | Source: Ambulatory Visit | Attending: Physician Assistant | Admitting: Physician Assistant

## 2021-03-15 ENCOUNTER — Telehealth: Payer: Self-pay | Admitting: Physician Assistant

## 2021-03-15 ENCOUNTER — Other Ambulatory Visit: Payer: Self-pay

## 2021-03-15 MED ORDER — GADOBENATE DIMEGLUMINE 529 MG/ML IV SOLN: 15.0000 mL | Freq: Once | INTRAVENOUS | Status: DC | PRN

## 2021-03-15 NOTE — Telephone Encounter (Signed)
Patient was supposed to have an MRI today but panicked. He will need it resch and also needs something to calm him ?

## 2021-03-20 ENCOUNTER — Telehealth: Payer: Self-pay | Admitting: Physician Assistant

## 2021-03-20 NOTE — Telephone Encounter (Signed)
Patients wife is calling about [facility]'s results ?

## 2021-03-20 NOTE — Telephone Encounter (Signed)
Patient needs a sedative sent in, unable to do MRI, will need to rescedule appt, FYI. Please route back to me, once you sent RX in thanks. Pharmacy is correct in chart.  ?

## 2021-03-21 ENCOUNTER — Other Ambulatory Visit: Payer: Self-pay | Admitting: Physician Assistant

## 2021-03-21 MED ORDER — DIAZEPAM 5 MG PO TABS: ORAL_TABLET | ORAL | 0 refills | Status: DC

## 2021-03-21 NOTE — Telephone Encounter (Signed)
Advised to pick up Rx and call to reschedule MRI. She thanked me for calling, To call back if any other questions.  ?

## 2021-03-25 ENCOUNTER — Encounter (HOSPITAL_BASED_OUTPATIENT_CLINIC_OR_DEPARTMENT_OTHER): Payer: Self-pay | Admitting: Cardiology

## 2021-03-25 ENCOUNTER — Other Ambulatory Visit: Payer: Self-pay

## 2021-03-25 ENCOUNTER — Ambulatory Visit (INDEPENDENT_AMBULATORY_CARE_PROVIDER_SITE_OTHER): Payer: Medicare Other | Admitting: Cardiology

## 2021-03-25 VITALS — BP 140/86 | HR 87 | Ht 71.0 in | Wt 174.2 lb

## 2021-03-25 DIAGNOSIS — I480 Paroxysmal atrial fibrillation: Secondary | ICD-10-CM | POA: Diagnosis not present

## 2021-03-25 DIAGNOSIS — Z79899 Other long term (current) drug therapy: Secondary | ICD-10-CM

## 2021-03-25 DIAGNOSIS — Z8673 Personal history of transient ischemic attack (TIA), and cerebral infarction without residual deficits: Secondary | ICD-10-CM

## 2021-03-25 DIAGNOSIS — Q2112 Patent foramen ovale: Secondary | ICD-10-CM

## 2021-03-25 DIAGNOSIS — R030 Elevated blood-pressure reading, without diagnosis of hypertension: Secondary | ICD-10-CM

## 2021-03-25 DIAGNOSIS — Z95818 Presence of other cardiac implants and grafts: Secondary | ICD-10-CM

## 2021-03-25 DIAGNOSIS — E7800 Pure hypercholesterolemia, unspecified: Secondary | ICD-10-CM

## 2021-03-25 MED ORDER — ROSUVASTATIN CALCIUM 20 MG PO TABS: 20.0000 mg | ORAL_TABLET | Freq: Every day | ORAL | 3 refills | Status: DC

## 2021-03-25 NOTE — Patient Instructions (Addendum)
Medication Instructions:  ?Since your cholesterol numbers are not at goal, we will stop the atorvastatin and change to rosuvastatin 20 mg daily. We will recheck your cholesterol numbers in about 2 mos. ? ?I would like your blood pressure number to be <130/80, but I will leave the target up to your neurologist. ? ?*If you need a refill on your cardiac medications before your next appointment, please call your pharmacy* ? ? ?Lab Work: ?Your provider has recommended lab work in May, 2023 (Lipid). Please have this collected at [facility] at [place]. The lab is open 8:00 am - 4:30 pm. Please avoid 12:00p - 1:00p for lunch hour. You do not need an appointment. Please go to [address] [place], [place] [address]. This is in the Primary Care office on the 3rd floor, let them know you are there for blood work and they will direct you to the lab. ? ?If you have labs (blood work) drawn today and your tests are completely normal, you will receive your results only by: ?MyChart Message (if you have MyChart) OR ?A paper copy in the mail ?If you have any lab test that is abnormal or we need to change your treatment, we will call you to review the results. ? ? ?Testing/Procedures: ?None ordered today ? ? ?Follow-Up: ?At [facility], you and your health needs are our priority.  As part of our continuing mission to provide you with exceptional heart care, we have created designated Provider Care Teams.  These Care Teams include your primary Cardiologist (physician) and Advanced Practice Providers (APPs -  Physician Assistants and Nurse Practitioners) who all work together to provide you with the care you need, when you need it. ? ?We recommend signing up for the patient portal called "MyChart".  Sign up information is provided on this After Visit Summary.  MyChart is used to connect with patients for Virtual Visits (Telemedicine).  Patients are able to view lab/test results, encounter notes, upcoming  appointments, etc.  Non-urgent messages can be sent to your provider as well.   ?To learn more about what you can do with MyChart, go to [email or web address].   ? ?Your next appointment:   ?1 year(s) ? ?The format for your next appointment:   ?In Person ? ?Provider:   ?Jodelle Red, MD{ ? ? ?
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
## 2021-04-03 ENCOUNTER — Other Ambulatory Visit: Payer: Self-pay

## 2021-04-03 ENCOUNTER — Other Ambulatory Visit: Payer: Self-pay | Admitting: Physician Assistant

## 2021-04-03 ENCOUNTER — Ambulatory Visit (INDEPENDENT_AMBULATORY_CARE_PROVIDER_SITE_OTHER): Payer: Medicare Other | Admitting: Cardiology

## 2021-04-03 ENCOUNTER — Encounter: Payer: Self-pay | Admitting: Cardiology

## 2021-04-03 VITALS — BP 160/82 | HR 88 | Ht 71.0 in | Wt 175.2 lb

## 2021-04-03 DIAGNOSIS — I480 Paroxysmal atrial fibrillation: Secondary | ICD-10-CM | POA: Diagnosis not present

## 2021-04-03 DIAGNOSIS — Z8673 Personal history of transient ischemic attack (TIA), and cerebral infarction without residual deficits: Secondary | ICD-10-CM

## 2021-04-03 NOTE — Progress Notes (Signed)
? ?Electrophysiology Office Note ? ? ?Date:  04/03/2021  ? ?ID:  Steven Oliver, DOB [birth year], MRN [id number] ? ?PCP:  Orma Render, NP  ?Cardiologist:  Harrell Gave ?Primary Electrophysiologist:  Georgia Delsignore Meredith Leeds, MD   ? ?Chief Complaint: AF ?  ?History of Present Illness: ?Steven Oliver is a 71 y.o. male who is being seen today for the evaluation of AF at the request of Buford Dresser,*. Presenting today for electrophysiology evaluation. ? ?He has a history significant for CVA, TIA, atrial fibrillation, hyperlipidemia.  He recently moved here from [facility] to be closer to family.  He had a stroke 02/20/2018 when he traveled to [place].  He was found to have a PFO.  He had a Linq monitor implanted at that time.  He has had 1 episode of atrial fibrillation and is now on Eliquis.  Since his stroke, he has had memory issues. ? ?Today, he denies symptoms of palpitations, chest pain, shortness of breath, orthopnea, PND, lower extremity edema, claudication, dizziness, presyncope, syncope, bleeding, or neurologic sequela. The patient is tolerating medications without difficulties.  Today he feels well.  He has no chest pain or shortness of breath.  His blood pressure is mildly elevated, but he has known whitecoat hypertension. ? ? ?Past Medical History:  ?Diagnosis Date  ? CVA, old, cognitive deficits   ? Hyperlipidemia   ? Hypertension   ? ?History reviewed. No pertinent surgical history. ? ? ?Current Outpatient Medications  ?Medication Sig Dispense Refill  ? apixaban (ELIQUIS) 5 MG TABS tablet Take 1 tablet by mouth 2 (two) times daily.    ? cholecalciferol (VITAMIN D) 25 MCG (1000 UNIT) tablet Take by mouth.    ? diazepam (VALIUM) 5 MG tablet Take 1 tab, 30 mins before the MRI, may take an extra tab if necessary 2 tablet 0  ? memantine (NAMENDA) 10 MG tablet Take 1 tablet (10 mg total) by mouth 2 (two) times daily. 60 tablet 11  ? Multiple Vitamins-Minerals (CENTRUM SILVER PO) Take by mouth.    ?  rosuvastatin (CRESTOR) 20 MG tablet Take 1 tablet (20 mg total) by mouth daily. 90 tablet 3  ? XIIDRA 5 % SOLN Apply 1 drop to eye 2 (two) times daily.    ? ?No current facility-administered medications for this visit.  ? ? ?Allergies:   Patient has no known allergies.  ? ?Social History:  The patient  reports that he has been smoking cigarettes. He has never used smokeless tobacco. He reports current alcohol use of about 1.0 standard drink per week. He reports that he does not use drugs.  ? ?Family History:  The patient's family history includes Alzheimer's disease in his maternal aunt and mother.  ? ? ?ROS:  Please see the history of present illness.   Otherwise, review of systems is positive for none.   All other systems are reviewed and negative.  ? ? ?PHYSICAL EXAM: ?VS:  BP (!) 160/82   Pulse 88   Ht 5\' 11"  (1.803 m)   Wt 175 lb 3.2 oz (79.5 kg)   SpO2 95%   BMI 24.44 kg/m?  , BMI Body mass index is 24.44 kg/m?. ?GEN: Well nourished, well developed, in no acute distress  ?HEENT: normal  ?Neck: no JVD, carotid bruits, or masses ?Cardiac: RRR; no murmurs, rubs, or gallops,no edema  ?Respiratory:  clear to auscultation bilaterally, normal work of breathing ?GI: soft, nontender, nondistended, + BS ?MS: no deformity or atrophy  ?Skin: warm and dry, device pocket  is well healed ?Neuro:  Strength and sensation are intact ?Psych: euthymic mood, full affect ? ?EKG:  EKG is not ordered today. ?Personal review of the ekg ordered 03/25/21 shows sinus rhythm, rate 87 ? ?Device interrogation is reviewed today in detail.  See PaceArt for details. ? ? ?Recent Labs: ?02/28/2021: ALT 13; BUN 15; Creatinine, Ser 1.02; Hemoglobin 15.0; Platelets 249; Potassium 4.5; Sodium 141  ? ? ?Lipid Panel  ?   ?Component Value Date/Time  ? CHOL 167 02/28/2021 1036  ? TRIG 84 02/28/2021 1036  ? HDL 65 02/28/2021 1036  ? CHOLHDL 2.6 02/28/2021 1036  ? Steven Oliver 86 02/28/2021 1036  ? ? ? ?Wt Readings from Last 3 Encounters:  ?04/03/21 175 lb  3.2 oz (79.5 kg)  ?03/25/21 174 lb 3.2 oz (79 kg)  ?03/12/21 171 lb (77.6 kg)  ?  ? ? ?Other studies Reviewed: ?Additional studies/ records that were reviewed today include: epic notes ? ?ASSESSMENT AND PLAN: ? ?1.  Paroxysmal atrial fibrillation: CHA2DS2-VASc of at least 3.  Currently on Eliquis 5 mg twice daily.  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  Steven Oliver need Eliquis lifelong.  He does have a Linq monitor implanted for stroke monitoring.  We Steven Oliver arrange for follow-up in device clinic. ? ?2.  Hyperlipidemia: Continue atorvastatin 80 mg per primary cardiology ? ? ? ?Current medicines are reviewed at length with the patient today.   ?The patient does not have concerns regarding his medicines.  The following changes were made today:  none ? ?Labs/ tests ordered today include:  ?No orders of the defined types were placed in this encounter. ? ? ? ?Disposition:   FU with Steven Oliver based on the link monitor results ? ?Signed, ?Steven Schleifer Meredith Leeds, MD  ?04/03/2021 11:41 AM    ? ?[facility] ?[address] ?[address] ?[place] [address] ?[phone] (office) ?[phone] (fax) ? ?

## 2021-04-03 NOTE — Patient Instructions (Addendum)
Medication Instructions:  ?Your physician recommends that you continue on your current medications as directed. Please refer to the Current Medication list given to you today. ?*If you need a refill on your cardiac medications before your next appointment, please call your pharmacy* ? ?Lab Work: ?None ordered. ?If you have labs (blood work) drawn today and your tests are completely normal, you will receive your results only by: ?MyChart Message (if you have MyChart) OR ?A paper copy in the mail ?If you have any lab test that is abnormal or we need to change your treatment, we will call you to review the results. ? ?Testing/Procedures: ?None ordered. ? ?Follow-Up: ?At [facility], you and your health needs are our priority.  As part of our continuing mission to provide you with exceptional heart care, we have created designated Provider Care Teams.  These Care Teams include your primary Cardiologist (physician) and Advanced Practice Providers (APPs -  Physician Assistants and Nurse Practitioners) who all work together to provide you with the care you need, when you need it. ? ?Your next appointment:   ?Your physician wants you to follow-up in: we will see you when your loop recorder gets near end of life. ? ?We will follow your loop recorder monthly remotely. ? ? ?

## 2021-04-04 ENCOUNTER — Telehealth: Payer: Self-pay

## 2021-04-04 NOTE — Telephone Encounter (Signed)
I called [facility] to release the patient in Carelink. ?

## 2021-04-06 ENCOUNTER — Ambulatory Visit
Admission: RE | Admit: 2021-04-06 | Discharge: 2021-04-06 | Disposition: A | Discharge status: Home / Self Care | Payer: Medicare Other | Source: Ambulatory Visit | Attending: Physician Assistant | Admitting: Physician Assistant

## 2021-04-06 ENCOUNTER — Other Ambulatory Visit: Payer: Self-pay

## 2021-04-06 MED ORDER — GADOBENATE DIMEGLUMINE 529 MG/ML IV SOLN
15.0000 mL | Freq: Once | INTRAVENOUS | Status: AC | PRN
  Administered 2021-04-06: 15 mL via INTRAVENOUS

## 2021-04-08 NOTE — Telephone Encounter (Signed)
The patient is now in our Carelink. He is on a remote schedule. His first remote with [place] is on 04-15-2021. ?

## 2021-04-09 ENCOUNTER — Encounter: Payer: Self-pay | Admitting: Physician Assistant

## 2021-04-15 ENCOUNTER — Other Ambulatory Visit (HOSPITAL_COMMUNITY)
Admission: RE | Admit: 2021-04-15 | Discharge: 2021-04-15 | Disposition: A | Discharge status: Home / Self Care | Payer: Medicare Other | Source: Ambulatory Visit | Attending: Physician Assistant | Admitting: Physician Assistant

## 2021-04-15 ENCOUNTER — Ambulatory Visit (INDEPENDENT_AMBULATORY_CARE_PROVIDER_SITE_OTHER): Payer: Medicare Other

## 2021-04-15 ENCOUNTER — Encounter: Payer: Self-pay | Admitting: Cardiology

## 2021-04-15 ENCOUNTER — Ambulatory Visit
Admission: RE | Admit: 2021-04-15 | Discharge: 2021-04-15 | Disposition: A | Discharge status: Home / Self Care | Payer: Medicare Other | Source: Ambulatory Visit | Attending: Physician Assistant | Admitting: Physician Assistant

## 2021-04-15 VITALS — BP 166/87 | HR 73

## 2021-04-15 DIAGNOSIS — R413 Other amnesia: Secondary | ICD-10-CM | POA: Diagnosis present

## 2021-04-15 DIAGNOSIS — Z8673 Personal history of transient ischemic attack (TIA), and cerebral infarction without residual deficits: Secondary | ICD-10-CM

## 2021-04-15 NOTE — Discharge Instructions (Signed)

## 2021-04-15 NOTE — Progress Notes (Signed)
1 vial of blood drawn in patients right forearm, one attempt successful ?

## 2021-04-16 LAB — CUP PACEART REMOTE DEVICE CHECK
Date Time Interrogation Session: 20230403051353
Implantable Pulse Generator Implant Date: 20200528

## 2021-04-16 LAB — CYTOLOGY - NON PAP

## 2021-04-27 LAB — LIPID PANEL
Chol/HDL Ratio: 2.4 ratio (ref 0.0–5.0)
Cholesterol, Total: 135 mg/dL (ref 100–199)
HDL: 57 mg/dL (ref >39)
LDL Chol Calc (NIH): 63 mg/dL (ref 0–99)
Triglycerides: 76 mg/dL (ref 0–149)
VLDL Cholesterol Cal: 15 mg/dL (ref 5–40)

## 2021-04-30 LAB — MAYO MISC ORDER 2

## 2021-04-30 NOTE — Progress Notes (Signed)
Carelink Summary Report / Loop Recorder 

## 2021-05-02 LAB — QUEST INFECTIOUS DISEASE MISCELLANEOUS

## 2021-05-03 ENCOUNTER — Other Ambulatory Visit: Payer: Self-pay | Admitting: Physician Assistant

## 2021-05-03 NOTE — Telephone Encounter (Signed)
Patient has follow up with Sharene Butters PA-for April 28 ?

## 2021-05-10 ENCOUNTER — Ambulatory Visit (INDEPENDENT_AMBULATORY_CARE_PROVIDER_SITE_OTHER): Payer: Medicare Other | Admitting: Physician Assistant

## 2021-05-10 ENCOUNTER — Encounter: Payer: Self-pay | Admitting: Physician Assistant

## 2021-05-10 VITALS — BP 152/92 | HR 92 | Ht 71.0 in | Wt 177.0 lb

## 2021-05-10 DIAGNOSIS — F01C0 Vascular dementia, severe, without behavioral disturbance, psychotic disturbance, mood disturbance, and anxiety: Secondary | ICD-10-CM | POA: Diagnosis not present

## 2021-05-10 NOTE — Patient Instructions (Addendum)
It was a pleasure to see you today at our office.  ? ?Recommendations: ? ?Meds: ?Follow up in  6 months ?Continue Memantine 10 mg two times  a day  ? ?Feel free to visit Facebook page " [employer]" for tips of how to care for people with memory problems.  ? ? ?RECOMMENDATIONS FOR ALL PATIENTS WITH MEMORY PROBLEMS: ?1. Continue to exercise (Recommend 30 minutes of walking everyday, or 3 hours every week) ?2. Increase social interactions - continue going to [place] and enjoy social gatherings with friends and family ?3. Eat healthy, avoid fried foods and eat more fruits and vegetables ?4. Maintain adequate blood pressure, blood sugar, and blood cholesterol level. Reducing the risk of stroke and cardiovascular disease also helps promoting better memory. ?5. Avoid stressful situations. Live a simple life and avoid aggravations. Organize your time and prepare for the next day in anticipation. ?6. Sleep well, avoid any interruptions of sleep and avoid any distractions in the bedroom that may interfere with adequate sleep quality ?7. Avoid sugar, avoid sweets as there is a strong link between excessive sugar intake, diabetes, and cognitive impairment ?We discussed the Mediterranean diet, which has been shown to help patients reduce the risk of progressive memory disorders and reduces cardiovascular risk. This includes eating fish, eat fruits and green leafy vegetables, nuts like almonds and hazelnuts, walnuts, and also use olive oil. Avoid fast foods and fried foods as much as possible. Avoid sweets and sugar as sugar use has been linked to worsening of memory function. ? ?There is always a concern of gradual progression of memory problems. If this is the case, then we may need to adjust level of care according to patient needs. Support, both to the patient and caregiver, should then be put into place.  ? ? ?The Alzheimer?s Association is here all day, every day for people facing Alzheimer?s disease through our free 24/7  Helpline: [phone]. The Helpline provides reliable information and support to all those who need assistance, such as individuals living with memory loss, Alzheimer's or other dementia, caregivers, health care professionals and the public.  ?Our highly trained and knowledgeable staff can help you with: ?Understanding memory loss, dementia and Alzheimer's  ?Medications and other treatment options  ?General information about aging and brain health  ?Skills to provide quality care and to find the best care from professionals  ?Legal, financial and living-arrangement decisions ?Our Helpline also features: ?Confidential care consultation provided by master's level clinicians who can help with decision-making support, crisis assistance and education on issues families face every day  ?Help in a caller's preferred language using our translation service that features more than 200 languages and dialects  ?Referrals to local community programs, services and ongoing support ? ? ? ? ?FALL PRECAUTIONS: Be cautious when walking. Scan the area for obstacles that may increase the risk of trips and falls. When getting up in the mornings, sit up at the edge of the bed for a few minutes before getting out of bed. Consider elevating the bed at the head end to avoid drop of blood pressure when getting up. Walk always in a well-lit room (use night lights in the walls). Avoid area rugs or power cords from appliances in the middle of the walkways. Use a walker or a cane if necessary and consider physical therapy for balance exercise. Get your eyesight checked regularly. ? ?FINANCIAL OVERSIGHT: Supervision, especially oversight when making financial decisions or transactions is also recommended. ? ?HOME SAFETY: Consider the safety of  the kitchen when operating appliances like stoves, microwave oven, and blender. Consider having supervision and share cooking responsibilities until no longer able to participate in those. Accidents with  firearms and other hazards in the house should be identified and addressed as well. ? ? ?ABILITY TO BE LEFT ALONE: If patient is unable to contact 911 operator, consider using LifeLine, or when the need is there, arrange for someone to stay with patients. Smoking is a fire hazard, consider supervision or cessation. Risk of wandering should be assessed by caregiver and if detected at any point, supervision and safe proof recommendations should be instituted. ? ?MEDICATION SUPERVISION: Inability to self-administer medication needs to be constantly addressed. Implement a mechanism to ensure safe administration of the medications. ? ? ?DRIVING: Regarding driving, in patients with progressive memory problems, driving will be impaired. We advise to have someone else do the driving if trouble finding directions or if minor accidents are reported. Independent driving assessment is available to determine safety of driving. ? ? ?If you are interested in the driving assessment, you can contact the following: ? ?[employer] in [place] ? ?[facility] [phone] ? ?[facility] [phone] ? ?[facility] [phone] or [phone] ? ?  ? ? ?Mediterranean Diet ?A Mediterranean diet refers to food and lifestyle choices that are based on the traditions of countries located on the [employer]. This way of eating has been shown to help prevent certain conditions and improve outcomes for people who have chronic diseases, like kidney disease and heart disease. ?What are tips for following this plan? ?Lifestyle  ?Cook and eat meals together with your family, when possible. ?Drink enough fluid to keep your urine clear or pale yellow. ?Be physically active every day. This includes: ?Aerobic exercise like running or swimming. ?Leisure activities like gardening, walking, or housework. ?Get 7-8 hours of sleep each night. ?If recommended by your health care provider,  drink red wine in moderation. This means 1 glass a day for nonpregnant women and 2 glasses a day for men. A glass of wine equals 5 oz (150 mL). ?Reading food labels  ?Check the serving size of packaged foods. For foods such as rice and pasta, the serving size refers to the amount of cooked product, not dry. ?Check the total fat in packaged foods. Avoid foods that have saturated fat or trans fats. ?Check the ingredients list for added sugars, such as corn syrup. ?Shopping  ?At the grocery store, buy most of your food from the areas near the walls of the store. This includes: ?Fresh fruits and vegetables (produce). ?Grains, beans, nuts, and seeds. Some of these may be available in unpackaged forms or large amounts (in bulk). ?Fresh seafood. ?Poultry and eggs. ?Low-fat dairy products. ?Buy whole ingredients instead of prepackaged foods. ?Buy fresh fruits and vegetables in-season from local farmers markets. ?Buy frozen fruits and vegetables in resealable bags. ?If you do not have access to quality fresh seafood, buy precooked frozen shrimp or canned fish, such as tuna, salmon, or sardines. ?Buy small amounts of raw or cooked vegetables, salads, or olives from the deli or salad bar at your store. ?Stock your pantry so you always have certain foods on hand, such as olive oil, canned tuna, canned tomatoes, rice, pasta, and beans. ?Cooking  ?Cook foods with extra-virgin olive oil instead of using butter or other vegetable oils. ?Have meat as a side dish, and have vegetables or grains as your main dish. This means having meat in small  portions or adding small amounts of meat to foods like pasta or stew. ?Use beans or vegetables instead of meat in common dishes like chili or lasagna. ?Experiment with different cooking methods. Try roasting or broiling vegetables instead of steaming or saut?eing them. ?Add frozen vegetables to soups, stews, pasta, or rice. ?Add nuts or seeds for added healthy fat at each meal. You can add  these to yogurt, salads, or vegetable dishes. ?Marinate fish or vegetables using olive oil, lemon juice, garlic, and fresh herbs. ?Meal planning  ?Plan to eat 1 vegetarian meal one day each week. Try to work up to 2 v

## 2021-05-10 NOTE — Progress Notes (Signed)
? ?Assessment/Plan:  ? ? ? 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  ?Patient was last seen at our office on 03/12/21  at which time his MoCA was 4/30. MRI brain 04/06/21 with significant chronic vascular disease, including chronic RACA infarct, and prominent cerebellar involvement. LP 04/15/2021 remarkable for 14.3.3 6223, [id number], pTAU 0.042 ( high) consistent with the presence of pathological changes associated with Alzheimer's disease. Other labs were unremarkable. He is on memantine 10 mg bid, tolerating well.  ? ? Recommendations:  ? ?Discussed safety both in and out of the home.  ?Discussed the importance of regular daily schedule with inclusion of crossword puzzles to maintain brain function.  ?Continue to monitor mood by PCP ?Stay active at least 30 minutes at least 3 times a week.  ?Naps should be scheduled and should be no longer than 60 minutes and should not occur after 2 PM.  ?Mediterranean diet is recommended  ?Control cardiovascular risk factors  ?Continue memantine  10 mg  bid. Side effects were discussed ?Follow up in 6  months. ? ? ?Case discussed with Dr. Karel JarvisAquino who agrees with the plan ? ?Subjective:  ? ? ?Steven Oliver is a very pleasant 71 y.o. RH male  seen today in follow up for memory loss. This patient is accompanied in the office by his wife who supplements the history.  Previous records as well as any outside records available were reviewed prior to todays visit.  Patient is on memantine 10 mg bid, tolerating well.  ? ?Any changes in memory since last visit? Denied. His wife reports that his memory is about the same. He has more difficulty with ADLs than before:  "Does not know how to set the table anymore". There are times that he doesn't recognize me. ?Patient lives with: Spouse  ?repeats oneself? Denies  ?Disoriented when walking into a room?  Endorsed, goes into the bathroom but does not know if the door is to the bathroom or bedroom. Sometimes he does not recognize  the house he is in.  ?Leaving objects in unusual places? Endorsed. Sometimes he leaves objects in the freezer.  ?Ambulates  with difficulty?   Patient denies   ?Recent falls?  Patient denies   ?Any head injuries?  Patient denies   ?History of seizures?   Patient denies   ?Wandering behavior?  Patient denies   ?Patient drives?   Patient no longer drives ?Any mood changes such irritability agitation?  Patient denies   ?Any history of depression?:  Patient denies   ?Hallucinations?  Patient denies   ?Paranoia?  Patient denies   ?Patient reports that he sleeps well without vivid dreams, REM behavior or sleepwalking    ?History of sleep apnea?  Patient denies   ?Any hygiene concerns?  Increased lack of hygiene, does not know how to regulate the temperature of the shower.  ?Independent of bathing and dressing? Endorsed. Less steady on the feet, sometimes in the middle of the night, he gets dress with 2 shirts on. He will wear the same thing if he is not assisted.  ?Does the patient needs help with medications?  Endorsed, wife assists.  ?Who is in charge of the finances? Wife is in charge.  ?Any changes in appetite?  Patient denies . "He has gained a few pounds" ?Patient have trouble swallowing? Patient denies   ?Does the patient cook?  Patient denies   ?Any kitchen accidents such as leaving the stove on? Patient denies   ?Any headaches?  Patient denies   ?The double vision? Patient denies   ?Any focal numbness or tingling?  Patient denies   ?Chronic back pain Patient denies   ?Unilateral weakness?  Patient denies   ?Any tremors?  Patient denies   ?Any history of anosmia?  Patient denies   ?Any incontinence of urine?  Patient denies   ?Any bowel dysfunction?   Patient denies    ? ? ?Initial visit 03/2021. The patient is seen in neurologic consultation at the request of Early, Sung Amabile, NP for the evaluation of memory.  The patient is accompanied by his wife who supplements the history.a pleasant 71 year old man who recently  relocated to this area to establish care in view of the diagnosis of dementia.   ?About 3 years ago, the patient suffered a CVA while in [place], found at the time that he had experienced several TIAs prior, without residual weaknessIitially he was "doing well 1 year after his CVA, except for some issues with his short-term memory, then beginning to show decline ".  In review, back in March of last year, his wife said he did not recognize her "several episodes like that"  "Most of the time he is fairly okay, but they are increasing moments of disorientation ".Sometimes he does not recognize the house that he is in, thinking [place] is [place] prior home.  He is also unable to do some home repairs that he used to do before. 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  Sometimes he asked his wife "where is my wife?  ".His confusion may last most of the day.  Recently, he was going to take his dog for a walk, but he only took the leash with him, "and forgot the dog ". He also does not know how to use the fork properly anymore.  There are some hygiene concerns, such as not wishing to take a shower.  He asked for "can I take it another time? ".The morning of this visit, he was needing help to dress up, he forgot how to put the belt on. ?He leaves objects in different places, for example not realizing after eating ice cream to put it back in the freezer, and instead only putting the lid. ?He ambulates without difficulty, denies any recent falls or head injuries. He sleeps fairly well unless he has to get up to go to the bathroom.  His wife is in charge of the medications, as well as finances (she was a Psychologist, occupational before).  He currently denies any headaches, double vision, dizziness, focal numbness or tingling, unilateral weakness, tremors or anosmia or seizures.  Denies urine incontinence, retention, constipation or diarrhea.  Denies sleep apnea, alcohol or tobacco.  Family  history significant for mother with Alzheimer's disease, he has sisters and uncles with dementia.headaches, double vision, dizziness, focal numbness or tingling, unilateral weakness, tremors or anosmia. No history of seizures. Denies urine incontinence, retention, constipation or diarrhea.  Denies OSA, ETOH or Tobacco.   ?  ?PREVIOUS MEDICATIONS:  ? ?CURRENT MEDICATIONS:  ?Outpatient Encounter Medications as of 05/10/2021  ?Medication Sig  ? apixaban (ELIQUIS) 5 MG TABS tablet Take 1 tablet by mouth 2 (two) times daily.  ? cholecalciferol (VITAMIN D) 25 MCG (1000 UNIT) tablet Take by mouth.  ? diazepam (VALIUM) 5 MG tablet Take 1 tab, 30 mins before the MRI, may take an extra tab if necessary  ? memantine (NAMENDA) 10 MG tablet  TAKE 1 TABLET BY MOUTH TWICE A DAY  ? Multiple Vitamins-Minerals (CENTRUM SILVER PO) Take by mouth.  ? rosuvastatin (CRESTOR) 20 MG tablet Take 1 tablet (20 mg total) by mouth daily.  ? XIIDRA 5 % SOLN Apply 1 drop to eye 2 (two) times daily.  ? ?No facility-administered encounter medications on file as of 05/10/2021.  ? ? ? ?Objective:  ?  ? ?PHYSICAL EXAMINATION:   ? ?VITALS:   ?Vitals:  ? 05/10/21 1049  ?BP: (!) 152/92  ?Pulse: 92  ?SpO2: 97%  ?Weight: 177 lb (80.3 kg)  ?Height: 5\' 11"  (1.803 m)  ? ? ?GEN:  The patient appears stated age and is in NAD. ?HEENT:  Normocephalic, atraumatic.  ? ?Neurological examination: ? ?General: NAD, well-groomed, appears stated age. ?Orientation: The patient is alert. Oriented to person, not to place and date ?Cranial nerves: There is good facial symmetry.The speech is fluent and clear. No aphasia or dysarthria. Fund of knowledge is reduced . Recent and remote memory are impaired. Attention and concentration are reduced.  Able to name objects 1/3 and unable to repeat phrases.  Hearing is intact to conversational tone.    ?Sensation: Sensation is intact to light touch throughout ?Motor: Strength is at least antigravity x4. ?Tremors: none  ?DTR's 2/4 in UE/LE   ? ? ?  03/13/2021  ?  9:00 PM  ?Montreal Cognitive Assessment   ?Visuospatial/ Executive (0/5) 0  ?Naming (0/3) 1  ?Attention: Read list of digits (0/2) 2  ?Attention: Read list of letters (0/1) 0  ?At

## 2021-05-18 LAB — CUP PACEART REMOTE DEVICE CHECK
Date Time Interrogation Session: 20230506051433
Implantable Pulse Generator Implant Date: 20200528

## 2021-05-20 ENCOUNTER — Ambulatory Visit (INDEPENDENT_AMBULATORY_CARE_PROVIDER_SITE_OTHER): Payer: Medicare Other

## 2021-05-20 DIAGNOSIS — I480 Paroxysmal atrial fibrillation: Secondary | ICD-10-CM | POA: Diagnosis not present

## 2021-05-28 ENCOUNTER — Ambulatory Visit (INDEPENDENT_AMBULATORY_CARE_PROVIDER_SITE_OTHER): Payer: Medicare Other | Admitting: Nurse Practitioner

## 2021-05-28 ENCOUNTER — Encounter (HOSPITAL_BASED_OUTPATIENT_CLINIC_OR_DEPARTMENT_OTHER): Payer: Self-pay | Admitting: Nurse Practitioner

## 2021-05-28 VITALS — BP 122/82 | HR 86 | Ht 71.0 in | Wt 177.0 lb

## 2021-05-28 DIAGNOSIS — I10 Essential (primary) hypertension: Secondary | ICD-10-CM | POA: Insufficient documentation

## 2021-05-28 DIAGNOSIS — F01C0 Vascular dementia, severe, without behavioral disturbance, psychotic disturbance, mood disturbance, and anxiety: Secondary | ICD-10-CM

## 2021-05-28 DIAGNOSIS — I480 Paroxysmal atrial fibrillation: Secondary | ICD-10-CM

## 2021-05-28 DIAGNOSIS — E785 Hyperlipidemia, unspecified: Secondary | ICD-10-CM | POA: Diagnosis not present

## 2021-05-28 DIAGNOSIS — E559 Vitamin D deficiency, unspecified: Secondary | ICD-10-CM | POA: Diagnosis not present

## 2021-05-28 NOTE — Assessment & Plan Note (Signed)
Chronic. Well controlled at this time. No alarm sx. Will obtain labs today.  ?Recommend continued monitoring. At home readings show average BP in 120'-130's systolic with occasional elevations to 160's, but nothing higher. All elevations resolve within the same day.  ?No changes at this time.  ?

## 2021-05-28 NOTE — Patient Instructions (Addendum)
It was a pleasure seeing you today. I hope your time spent with [place] was pleasant and helpful. Please let us know if there is anything we can do to improve the service you receive.  ? ?I am so proud of you for quitting smoking!! ?I will reach out to Maralyn Sago to discuss medication options that may help with your mood and sleep. I will be in touch with you to discuss options.  ? ?Important Office Information ?Lab Results ?If labs were ordered, please note that you will see results through MyChart as soon as they come available from [facility].  ?It takes up to 5 business days for the results to be routed to me and for me to review them once all of the lab results have come through from [facility]. I will make recommendations based on your results and send these through MyChart or someone from the office will call you to discuss. If your labs are abnormal, we may contact you to schedule a visit to discuss the results and make recommendations.  ?If you have not heard from [place] within 5 business days or you have waited longer than a week and your lab results have not come through on MyChart, please feel free to call the office or send a message through MyChart to follow-up on these labs.  ? ?Referrals ?If referrals were placed today, the office where the referral was sent will contact you either by phone or through MyChart to set up scheduling. Please note that it can take up to a week for the referral office to contact you. If you do not hear from them in a week, please contact the referral office directly to inquire about scheduling.  ? ?Condition Treated ?If your condition worsens or you begin to have new symptoms, please schedule a follow-up appointment for further evaluation. If you are not sure if an appointment is needed, you may call the office to leave a message for the nurse and someone will contact you with recommendations.  ?If you have an urgent or life threatening emergency, please do not call the office, but seek  emergency evaluation by calling 911 or going to the nearest emergency room for evaluation.  ? ?MyChart and Phone Calls ?Please do not use MyChart for urgent messages. It may take up to 3 business days for MyChart messages to be read by staff and if they are unable to handle the request, an additional 3 business days for them to be routed to me and for my response.  ?Messages sent to the provider through MyChart do not come directly to the provider, please allow time for these messages to be routed and for me to respond.  ?We get a large volume of MyChart messages daily and these are responded to in the order received.  ? ?For urgent messages, please call the office at [phone] and speak with the front office staff or leave a message on the line of my assistant for guidance.  ?We are seeing patients from the hours of 8:00 am through 5:00 pm and calls directly to the nurse may not be answered immediately due to seeing patients, but your call will be returned as soon as possible.  ?Phone  messages received after 4:00 PM Monday through Thursday may not be returned until the following business day. Phone messages received after 11:00 AM on Friday may not be returned until Monday.  ? ?After Hours ?We share on call hours with providers from other offices. If you have an  urgent need after hours that cannot wait until the next business day, please contact the on call provider by calling the office number. A nurse will speak with you and contact the provider if needed for recommendations.  ?If you have an urgent or life threatening emergency after hours, please do not call the on call provider, but seek emergency evaluation by calling 911 or going to the nearest emergency room for evaluation.  ? ?Paperwork ?All paperwork requires a minimum of 5 days to complete and return to you or the designated personnel. Please keep this in mind when bringing in forms or sending requests for paperwork completion to the office.  ?  ?

## 2021-05-28 NOTE — Assessment & Plan Note (Signed)
Chronic. Well controlled today. Occasional ectopic beats detected with otherwise RRR. No alarm sx present today. Patient on anticoagulant with no signs of bleeding.  ?Recommend continue with current treatment and monitor if new or worsening signs appear.  ?

## 2021-05-28 NOTE — Assessment & Plan Note (Signed)
Repeat labs today

## 2021-05-28 NOTE — Assessment & Plan Note (Signed)
Chronic. Very well controlled. Review of recent labs today with patient and wife. No alarm sx present. No SE from new medication. Will continue current medication and dosage and monitor.  ?

## 2021-05-28 NOTE — Assessment & Plan Note (Signed)
Recent diagnosis of Alzheimer Disease based on lumbar puncture results.  ?Patient accepting of diagnosis and appears to be handling well. Wife very supportive.  ?Suspected sundowning behaviors present with increased agitation in the evenings. Will reach out to neurology to discuss medication management for this. I do feel he would benefit from SSRI or possible atypical to help with sleep, but will plan for neuro input and contact patient with recommendations.  ?

## 2021-05-28 NOTE — Progress Notes (Signed)
?Steven Keeler, DNP, AGNP-c ?[facility] ?[address] ?[address] ?[place],  [address] ?[phone] Office [phone] Fax ? ?ESTABLISHED PATIENT- Chronic Health and/or Follow-Up Visit ? ?Blood pressure 122/82, pulse 86, height 5\' 11"  (1.803 m), weight 177 lb (80.3 kg), SpO2 96 %. ? ?Hypertension and Hyperlipidemia ? ? ?HPI ? ?Baylie Strumpf  is a 71 y.o. year old male presenting today for evaluation and management of the following: ?Afib ?No concerns ?No signs of bleeding present at this time ?No CP, palpitations, dizziness, ShOB, HA ?HTN ?Very well controlled at this time ?No CP, ShoB, palpitations, HA, vision changes ?LE edema present bilaterally- no erythema or localized pain ?HLD ?Recent change to rosuvastatin by cardiology ?Tolerating well ?BP improved since start of medication per patient ?Home BP readings presented today ?Alzheimers ?Lumbar puncture with confirmation diagnosis ?TAU highly elevated ?Mother's family strong history: 3/4 siblings, mother, and maternal aunts all with diagnosis ?Difficulty sleeping and episodes of forgetfulness at night with "wanting to go home" ?Some agitation in the evenings ?Would like to discuss medication options that may be helpful ? ?ROS ?All ROS negative with exception of what is listed in HPI ? ?PHYSICAL EXAM ?Physical Exam ?Vitals and nursing note reviewed.  ?Constitutional:   ?   General: He is not in acute distress. ?   Appearance: Normal appearance.  ?HENT:  ?   Head: Normocephalic.  ?Eyes:  ?   Extraocular Movements: Extraocular movements intact.  ?   Conjunctiva/sclera: Conjunctivae normal.  ?   Pupils: Pupils are equal, round, and reactive to light.  ?Neck:  ?   Vascular: No carotid bruit.  ?Cardiovascular:  ?   Rate and Rhythm: Normal rate. Occasional Extrasystoles are present. ?   Chest Wall: PMI is not displaced. No thrill.  ?   Pulses: Normal pulses.  ?Pulmonary:  ?   Effort: Pulmonary effort is normal.  ?   Breath  sounds: Normal breath sounds. No wheezing.  ?Abdominal:  ?   General: Bowel sounds are normal. There is no distension.  ?   Palpations: Abdomen is soft.  ?   Tenderness: There is no abdominal tenderness. There is no right CVA tenderness, left CVA tenderness or guarding.  ?Musculoskeletal:  ?   Cervical back: Normal range of motion. No tenderness.  ?   Right lower leg: 1+ Pitting Edema present.  ?   Left lower leg: 1+ Pitting Edema present.  ?Lymphadenopathy:  ?   Cervical: No cervical adenopathy.  ?Skin: ?   General: Skin is warm and dry.  ?   Capillary Refill: Capillary refill takes less than 2 seconds.  ?   Comments: Mild varicosities noted on the left calf on medial side with no skin breakdown or discoloration present. No warmth or erythema present to LE.   ?Neurological:  ?   General: No focal deficit present.  ?   Mental Status: He is alert and oriented to person, place, and time.  ?   Cranial Nerves: No cranial nerve deficit.  ?   Motor: No weakness.  ?   Coordination: Coordination normal.  ?Psychiatric:     ?   Mood and Affect: Mood normal.     ?   Behavior: Behavior normal.     ?   Thought Content: Thought content normal.     ?   Judgment: Judgment normal.  ? ? ?ASSESSMENT & PLAN ?Problem List Items Addressed This Visit   ? ? Dyslipidemia  ?  Chronic. Very  well controlled. Review of recent labs today with patient and wife. No alarm sx present. No SE from new medication. Will continue current medication and dosage and monitor.  ? ?  ?  ? Relevant Orders  ? CBC with Differential/Platelet  ? Comprehensive metabolic panel  ? VITAMIN D 25 Hydroxy (Vit-D Deficiency, Fractures)  ? PAF (paroxysmal atrial fibrillation) ([place])  ?  Chronic. Well controlled today. Occasional ectopic beats detected with otherwise RRR. No alarm sx present today. Patient on anticoagulant with no signs of bleeding.  ?Recommend continue with current treatment and monitor if new or worsening signs appear.  ? ?  ?  ? Relevant Orders  ? CBC with  Differential/Platelet  ? Comprehensive metabolic panel  ? VITAMIN D 25 Hydroxy (Vit-D Deficiency, Fractures)  ? Vitamin D deficiency  ?  Repeat labs today.  ? ?  ?  ? Relevant Orders  ? CBC with Differential/Platelet  ? Comprehensive metabolic panel  ? VITAMIN D 25 Hydroxy (Vit-D Deficiency, Fractures)  ? 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  Recent diagnosis of Alzheimer Disease based on lumbar puncture results.  ?Patient accepting of diagnosis and appears to be handling well. Wife very supportive.  ?Suspected sundowning behaviors present with increased agitation in the evenings. Will reach out to neurology to discuss medication management for this. I do feel he would benefit from SSRI or possible atypical to help with sleep, but will plan for neuro input and contact patient with recommendations.  ? ?  ?  ? Primary hypertension - Primary  ?  Chronic. Well controlled at this time. No alarm sx. Will obtain labs today.  ?Recommend continued monitoring. At home readings show average BP in [id number] systolic with occasional elevations to 160's, but nothing higher. All elevations resolve within the same day.  ?No changes at this time.  ? ?  ?  ? Relevant Orders  ? CBC with Differential/Platelet  ? Comprehensive metabolic panel  ? VITAMIN D 25 Hydroxy (Vit-D Deficiency, Fractures)  ? ? ? ?FOLLOW-UP ?Return for TBD- based on labs and new meds. ? ?Steven Keeler, DNP, AGNP-c ?

## 2021-05-29 LAB — VITAMIN D 25 HYDROXY (VIT D DEFICIENCY, FRACTURES): Vit D, 25-Hydroxy: 33.7 ng/mL (ref 30.0–100.0)

## 2021-05-29 LAB — COMPREHENSIVE METABOLIC PANEL
ALT: 14 IU/L (ref 0–44)
AST: 20 IU/L (ref 0–40)
Albumin/Globulin Ratio: 1.9 (ref 1.2–2.2)
Albumin: 4.6 g/dL (ref 3.8–4.8)
Alkaline Phosphatase: 80 IU/L (ref 44–121)
BUN/Creatinine Ratio: 16 (ref 10–24)
BUN: 16 mg/dL (ref 8–27)
Bilirubin Total: 0.5 mg/dL (ref 0.0–1.2)
CO2: 22 mmol/L (ref 20–29)
Calcium: 10.1 mg/dL (ref 8.6–10.2)
Chloride: 103 mmol/L (ref 96–106)
Creatinine, Ser: 0.97 mg/dL (ref 0.76–1.27)
Globulin, Total: 2.4 g/dL (ref 1.5–4.5)
Glucose: 96 mg/dL (ref 70–99)
Potassium: 4.9 mmol/L (ref 3.5–5.2)
Sodium: 141 mmol/L (ref 134–144)
Total Protein: 7 g/dL (ref 6.0–8.5)
eGFR: 84 mL/min/{1.73_m2} (ref >59)

## 2021-05-29 LAB — CBC WITH DIFFERENTIAL/PLATELET
Basophils Absolute: 0.1 10*3/uL (ref 0.0–0.2)
Basos: 1 %
EOS (ABSOLUTE): 0.2 10*3/uL (ref 0.0–0.4)
Eos: 2 %
Hematocrit: 42.6 % (ref 37.5–51.0)
Hemoglobin: 14.3 g/dL (ref 13.0–17.7)
Immature Grans (Abs): 0 10*3/uL (ref 0.0–0.1)
Immature Granulocytes: 0 %
Lymphocytes Absolute: 1.5 10*3/uL (ref 0.7–3.1)
Lymphs: 20 %
MCH: 30.2 pg (ref 26.6–33.0)
MCHC: 33.6 g/dL (ref 31.5–35.7)
MCV: 90 fL (ref 79–97)
Monocytes Absolute: 0.6 10*3/uL (ref 0.1–0.9)
Monocytes: 8 %
Neutrophils Absolute: 5.2 10*3/uL (ref 1.4–7.0)
Neutrophils: 69 %
Platelets: 222 10*3/uL (ref 150–450)
RBC: 4.73 x10E6/uL (ref 4.14–5.80)
RDW: 13.1 % (ref 11.6–15.4)
WBC: 7.5 10*3/uL (ref 3.4–10.8)

## 2021-05-30 ENCOUNTER — Other Ambulatory Visit: Payer: Self-pay | Admitting: Physician Assistant

## 2021-05-30 LAB — CSF CELL COUNT WITH DIFFERENTIAL
RBC Count, CSF: 1 cells/uL — ABNORMAL HIGH
WBC, CSF: 1 cells/uL (ref 0–5)

## 2021-05-30 LAB — PROTEIN, CSF: Total Protein, CSF: 73 mg/dL — ABNORMAL HIGH (ref 15–60)

## 2021-05-30 LAB — B. BURGDORFI ANTIBODIES, CSF: Lyme Ab: NEGATIVE

## 2021-05-30 LAB — VDRL, CSF: VDRL Quant, CSF: NONREACTIVE

## 2021-05-30 LAB — MAYO MISC ORDER: PRICE:: 1691.3

## 2021-05-30 LAB — CSF CULTURE W GRAM STAIN
MICRO NUMBER:: 13213927
Result:: NO GROWTH
SPECIMEN QUALITY:: ADEQUATE

## 2021-05-30 LAB — FUNGUS CULTURE W SMEAR
CULTURE:: NO GROWTH
MICRO NUMBER:: 13213926
SMEAR:: NONE SEEN
SPECIMEN QUALITY:: ADEQUATE

## 2021-05-30 LAB — MAYO MISC ORDER,CSF
Miscellaneous Test Results: NEGATIVE
NORMAL RANGE:: NEGATIVE
PRICE:: 125

## 2021-05-30 LAB — MYELIN BASIC PROTEIN, CSF: Myelin Basic Protein: <2 mcg/L (ref <=4.0)

## 2021-05-30 LAB — PROTEIN, CSF 14-3-3 (PRION DISEASE)
14-3-3 PROTEIN (CSF)++: 6223 AU/ml — ABNORMAL HIGH (ref 30–1999)
EST PROB PRION DIS IN PATIENT: <1 %
RT-QUIC (CSF)*: NEGATIVE
T-TAU PROTEIN (CSF)++: 743 pg/ml (ref 0–1149)

## 2021-05-30 LAB — MAYO MISC ORDER 3: PRICE:: 1240

## 2021-05-30 LAB — MYCOBACTERIA,CULT W/FLUOROCHROME SMEAR
MICRO NUMBER:: 13214150
SMEAR:: NONE SEEN
SPECIMEN QUALITY:: ADEQUATE

## 2021-05-30 LAB — CRYPTOCOCCAL AG, LTX SCR RFLX TITER
Cryptococcal Ag Screen: NOT DETECTED
MICRO NUMBER:: 13213925
SPECIMEN QUALITY:: ADEQUATE

## 2021-05-30 LAB — CNS IGG SYNTHESIS RATE, CSF+BLOOD
Albumin Serum: 4.4 g/dL (ref 3.6–5.1)
Albumin, CSF: 37.2 mg/dL (ref 8.0–42.0)
CNS-IgG Synthesis Rate: -2.1 mg/24 h (ref <=3.3)
IgG (Immunoglobin G), Serum: 833 mg/dL (ref 600–1540)
IgG Total CSF: 3.3 mg/dL (ref 0.8–7.7)
IgG-Index: 0.47 (ref <0.70)

## 2021-05-30 LAB — GLUCOSE, CSF: Glucose, CSF: 57 mg/dL (ref 40–80)

## 2021-05-30 LAB — OLIGOCLONAL BANDS, CSF + SERM

## 2021-05-30 MED ORDER — MEMANTINE HCL 10 MG PO TABS: 10.0000 mg | ORAL_TABLET | Freq: Two times a day (BID) | ORAL | 1 refills | Status: DC

## 2021-06-06 NOTE — Progress Notes (Signed)
Carelink Summary Report / Loop Recorder 

## 2021-06-21 ENCOUNTER — Encounter (HOSPITAL_BASED_OUTPATIENT_CLINIC_OR_DEPARTMENT_OTHER): Payer: Self-pay

## 2021-06-21 ENCOUNTER — Ambulatory Visit (INDEPENDENT_AMBULATORY_CARE_PROVIDER_SITE_OTHER): Payer: Medicare Other

## 2021-06-21 DIAGNOSIS — Z0000 Encounter for general adult medical examination without abnormal findings: Secondary | ICD-10-CM

## 2021-06-21 NOTE — Patient Instructions (Signed)
Health Maintenance, Male Adopting a healthy lifestyle and getting preventive care are important in promoting health and wellness. Ask your health care provider about: The right schedule for you to have regular tests and exams. Things you can do on your own to prevent diseases and keep yourself healthy. What should I know about diet, weight, and exercise? Eat a healthy diet  Eat a diet that includes plenty of vegetables, fruits, low-fat dairy products, and lean protein. Do not eat a lot of foods that are high in solid fats, added sugars, or sodium. Maintain a healthy weight Body mass index (BMI) is a measurement that can be used to identify possible weight problems. It estimates body fat based on height and weight. Your health care provider can help determine your BMI and help you achieve or maintain a healthy weight. Get regular exercise Get regular exercise. This is one of the most important things you can do for your health. Most adults should: Exercise for at least 150 minutes each week. The exercise should increase your heart rate and make you sweat (moderate-intensity exercise). Do strengthening exercises at least twice a week. This is in addition to the moderate-intensity exercise. Spend less time sitting. Even light physical activity can be beneficial. Watch cholesterol and blood lipids Have your blood tested for lipids and cholesterol at 71 years of age, then have this test every 5 years. You may need to have your cholesterol levels checked more often if: Your lipid or cholesterol levels are high. You are older than 71 years of age. You are at high risk for heart disease. What should I know about cancer screening? Many types of cancers can be detected early and may often be prevented. Depending on your health history and family history, you may need to have cancer screening at various ages. This may include screening for: Colorectal cancer. Prostate cancer. Skin cancer. Lung  cancer. What should I know about heart disease, diabetes, and high blood pressure? Blood pressure and heart disease High blood pressure causes heart disease and increases the risk of stroke. This is more likely to develop in people who have high blood pressure readings or are overweight. Talk with your health care provider about your target blood pressure readings. Have your blood pressure checked: Every 3-5 years if you are 18-39 years of age. Every year if you are 40 years old or older. If you are between the ages of 65 and 75 and are a current or former smoker, ask your health care provider if you should have a one-time screening for abdominal aortic aneurysm (AAA). Diabetes Have regular diabetes screenings. This checks your fasting blood sugar level. Have the screening done: Once every three years after age 45 if you are at a normal weight and have a low risk for diabetes. More often and at a younger age if you are overweight or have a high risk for diabetes. What should I know about preventing infection? Hepatitis B If you have a higher risk for hepatitis B, you should be screened for this virus. Talk with your health care provider to find out if you are at risk for hepatitis B infection. Hepatitis C Blood testing is recommended for: Everyone born from 1945 through 1965. Anyone with known risk factors for hepatitis C. Sexually transmitted infections (STIs) You should be screened each year for STIs, including gonorrhea and chlamydia, if: You are sexually active and are younger than 71 years of age. You are older than 71 years of age and your   health care provider tells you that you are at risk for this type of infection. Your sexual activity has changed since you were last screened, and you are at increased risk for chlamydia or gonorrhea. Ask your health care provider if you are at risk. Ask your health care provider about whether you are at high risk for HIV. Your health care provider  may recommend a prescription medicine to help prevent HIV infection. If you choose to take medicine to prevent HIV, you should first get tested for HIV. You should then be tested every 3 months for as long as you are taking the medicine. Follow these instructions at home: Alcohol use Do not drink alcohol if your health care provider tells you not to drink. If you drink alcohol: Limit how much you have to 0-2 drinks a day. Know how much alcohol is in your drink. In the [place] one drink equals one 12 oz bottle of beer (355 mL), one 5 oz glass of wine (148 mL), or one 1 oz glass of hard liquor (44 mL). Lifestyle Do not use any products that contain nicotine or tobacco. These products include cigarettes, chewing tobacco, and vaping devices, such as e-cigarettes. If you need help quitting, ask your health care provider. Do not use street drugs. Do not share needles. Ask your health care provider for help if you need support or information about quitting drugs. General instructions Schedule regular health, dental, and eye exams. Stay current with your vaccines. Tell your health care provider if: You often feel depressed. You have ever been abused or do not feel safe at home. Summary Adopting a healthy lifestyle and getting preventive care are important in promoting health and wellness. Follow your health care provider's instructions about healthy diet, exercising, and getting tested or screened for diseases. Follow your health care provider's instructions on monitoring your cholesterol and blood pressure. This information is not intended to replace advice given to you by your health care provider. Make sure you discuss any questions you have with your health care provider. Document Revised: 05/21/2020 Document Reviewed: 05/21/2020 Elsevier Patient Education  2023 Elsevier Inc.  

## 2021-06-21 NOTE — Progress Notes (Signed)
Subjective:   Steven Oliver is a 71 y.o. male who presents for an Initial Medicare Annual Wellness Visit. I connected with  Steven Oliver on 06/21/21 by a audio enabled telemedicine application and verified that I am speaking with the correct person using two identifiers.  Patient Location: Home  Provider Location: Home Office  I discussed the limitations of evaluation and management by telemedicine. The patient expressed understanding and agreed to proceed.     Objective:    There were no vitals filed for this visit. There is no height or weight on file to calculate BMI.     05/10/2021   10:56 AM 03/12/2021    1:24 PM  Advanced Directives  Does Patient Have a Medical Advance Directive? Yes Yes  Type of Estate agent of [place];Living will;Out of facility DNR (pink MOST or yellow form)     Current Medications (verified) Outpatient Encounter Medications as of 06/21/2021  Medication Sig   apixaban (ELIQUIS) 5 MG TABS tablet Take 1 tablet by mouth 2 (two) times daily.   cholecalciferol (VITAMIN D) 25 MCG (1000 UNIT) tablet Take by mouth.   memantine (NAMENDA) 10 MG tablet Take 1 tablet (10 mg total) by mouth 2 (two) times daily.   Multiple Vitamins-Minerals (CENTRUM SILVER PO) Take by mouth.   rosuvastatin (CRESTOR) 20 MG tablet Take 1 tablet (20 mg total) by mouth daily.   XIIDRA 5 % SOLN Apply 1 drop to eye 2 (two) times daily.   No facility-administered encounter medications on file as of 06/21/2021.    Allergies (verified) Patient has no known allergies.   History: Past Medical History:  Diagnosis Date   CVA, old, cognitive deficits    Hyperlipidemia    Hypertension    History reviewed. No pertinent surgical history. Family History  Problem Relation Age of Onset   Alzheimer's disease Mother    Alzheimer's disease Brother    Alzheimer's disease Maternal Aunt    Social History   Socioeconomic History   Marital status: Married    Spouse  name: Steven Oliver   Number of children: 2   Years of education: 12   Highest education level: Not on file  Occupational History   Not on file  Tobacco Use   Smoking status: Former    Types: Cigarettes    Quit date: 03/13/2021    Years since quitting: 0.2    Passive exposure: Never   Smokeless tobacco: Never  Vaping Use   Vaping Use: Never used  Substance and Sexual Activity   Alcohol use: Yes    Alcohol/week: 1.0 standard drink of alcohol    Types: 1 Glasses of wine per week   Drug use: Never   Sexual activity: Not Currently    Partners: Female  Other Topics Concern   Not on file  Social History Narrative   Right handed   Drinks caffeine   Two story home   Social Determinants of Health   Financial Resource Strain: Low Risk  (06/21/2021)   Overall Financial Resource Strain (CARDIA)    Difficulty of Paying Living Expenses: Not hard at all  Food Insecurity: No Food Insecurity (06/21/2021)   Hunger Vital Sign    Worried About Running Out of Food in the Last Year: Never true    Ran Out of Food in the Last Year: Never true  Transportation Needs: No Transportation Needs (06/21/2021)   PRAPARE - Administrator, Civil Service (Medical): No    Lack of Transportation (Non-Medical):  No  Physical Activity: Insufficiently Active (06/21/2021)   Exercise Vital Sign    Days of Exercise per Week: 7 days    Minutes of Exercise per Session: 20 min  Stress: No Stress Concern Present (06/21/2021)   [employer] - Occupational Stress Questionnaire    Feeling of Stress : Only a little  Social Connections: Moderately Isolated (06/21/2021)   Social Connection and Isolation Panel [NHANES]    Frequency of Communication with Friends and Family: Once a week    Frequency of Social Gatherings with Friends and Family: Twice a week    Attends Religious Services: Never    Diplomatic Services operational officerActive Member of Clubs or Organizations: No    Attends Engineer, structuralClub or Organization Meetings: Never    Marital  Status: Married    Tobacco Counseling Counseling given: Not Answered   Clinical Intake:  Pre-visit preparation completed: Yes  Pain : No/denies pain     Diabetes: No  How often do you need to have someone help you when you read instructions, pamphlets, or other written materials from your doctor or pharmacy?: 4 - Often What is the last grade level you completed in school?: 12th grade  Diabetic? No   Interpreter Needed?: No      Activities of Daily Living    06/21/2021   11:45 AM 05/28/2021    9:39 AM  In your present state of health, do you have any difficulty performing the following activities:  Hearing? 0 0  Vision? 0 0  Difficulty concentrating or making decisions? 1 0  Walking or climbing stairs? 0 0  Dressing or bathing? 0 0  Doing errands, shopping? 1 0    Patient Care Team: Early, Sung AmabileSara E, NP as PCP - General (Nurse Practitioner) Jodelle Redhristopher, Bridgette, MD as PCP - Cardiology (Cardiology) Elwyn ReachWertman, Sara E, PA-C (Neurology)  Indicate any recent Medical Services you may have received from other than [facility] providers in the past year (date may be approximate).     Assessment:   This is a routine wellness examination for [place].  Hearing/Vision screen No results found.  Dietary issues and exercise activities discussed:     Goals Addressed   None   Depression Screen    06/21/2021   11:41 AM 05/28/2021    9:39 AM 03/01/2021    4:21 PM  PHQ 2/9 Scores  PHQ - 2 Score 0 0 2  PHQ- 9 Score   7  Exception Documentation  Medical reason     Fall Risk    06/21/2021   11:45 AM 05/28/2021    9:38 AM 05/10/2021   10:55 AM 03/12/2021    1:23 PM 03/01/2021    4:20 PM  Fall Risk   Falls in the past year? 0 0 0 0 0  Number falls in past yr: 0 0 0 0 0  Injury with Fall? 0 0 0 0 0  Risk for fall due to : No Fall Risks No Fall Risks   No Fall Risks  Follow up Falls evaluation completed Falls evaluation completed;Education provided   Falls evaluation completed     FALL RISK PREVENTION PERTAINING TO THE HOME:  Any stairs in or around the home? Yes  If so, are there any without handrails? No  Home free of loose throw rugs in walkways, pet beds, electrical cords, etc? Yes  Adequate lighting in your home to reduce risk of falls? Yes   ASSISTIVE DEVICES UTILIZED TO PREVENT FALLS:  Life alert? No  Use of a  cane, walker or w/c? No  Grab bars in the bathroom? Yes  Shower chair or bench in shower? Yes  Elevated toilet seat or a handicapped toilet? No   Cognitive Function:      03/13/2021    9:00 PM  Montreal Cognitive Assessment   Visuospatial/ Executive (0/5) 0  Naming (0/3) 1  Attention: Read list of digits (0/2) 2  Attention: Read list of letters (0/1) 0  Attention: Serial 7 subtraction starting at 100 (0/3) 0  Language: Repeat phrase (0/2) 0  Language : Fluency (0/1) 0  Abstraction (0/2) 0  Delayed Recall (0/5) 0  Orientation (0/6) 1  Total 4  Adjusted Score (based on education) 4      06/21/2021   11:46 AM  6CIT Screen  What Year? 4 points  What month? 3 points  Count back from 20 0 points  Months in reverse 2 points  Repeat phrase 6 points    Immunizations Immunization History  Administered Date(s) Administered   Influenza-Unspecified 09/13/2020    TDAP status: Due, Education has been provided regarding the importance of this vaccine. Advised may receive this vaccine at local pharmacy or Health Dept. Aware to provide a copy of the vaccination record if obtained from local pharmacy or Health Dept. Verbalized acceptance and understanding.  Flu Vaccine status: Due, Education has been provided regarding the importance of this vaccine. Advised may receive this vaccine at local pharmacy or Health Dept. Aware to provide a copy of the vaccination record if obtained from local pharmacy or Health Dept. Verbalized acceptance and understanding.  Pneumococcal vaccine status: Due, Education has been provided regarding the importance of  this vaccine. Advised may receive this vaccine at local pharmacy or Health Dept. Aware to provide a copy of the vaccination record if obtained from local pharmacy or Health Dept. Verbalized acceptance and understanding.  Covid-19 vaccine status: Declined, Education has been provided regarding the importance of this vaccine but patient still declined. Advised may receive this vaccine at local pharmacy or Health Dept.or vaccine clinic. Aware to provide a copy of the vaccination record if obtained from local pharmacy or Health Dept. Verbalized acceptance and understanding.  Qualifies for Shingles Vaccine? Yes   Zostavax completed No   Shingrix Completed?: No.    Education has been provided regarding the importance of this vaccine. Patient has been advised to call insurance company to determine out of pocket expense if they have not yet received this vaccine. Advised may also receive vaccine at local pharmacy or Health Dept. Verbalized acceptance and understanding.  Screening Tests Health Maintenance  Topic Date Due   COVID-19 Vaccine (1) Never done   Pneumonia Vaccine 68+ Years old (1 - PCV) Never done   Hepatitis C Screening  Never done   TETANUS/TDAP  Never done   Zoster Vaccines- Shingrix (1 of 2) Never done   COLONOSCOPY (Pts 45-56yrs Insurance coverage will need to be confirmed)  Never done   INFLUENZA VACCINE  08/13/2021   HPV VACCINES  Aged Out    Health Maintenance  Health Maintenance Due  Topic Date Due   COVID-19 Vaccine (1) Never done   Pneumonia Vaccine 59+ Years old (1 - PCV) Never done   Hepatitis C Screening  Never done   TETANUS/TDAP  Never done   Zoster Vaccines- Shingrix (1 of 2) Never done   COLONOSCOPY (Pts 45-62yrs Insurance coverage will need to be confirmed)  Never done    Colorectal cancer screening: Referral to GI placed provider notified. Pt  aware the office will call re: appt.  Lung Cancer Screening: (Low Dose CT Chest recommended if Age 48-80 years, 30  pack-year currently smoking OR have quit w/in 15years.) does qualify.   Lung Cancer Screening Referral: provider notified  Additional Screening:  Hepatitis C Screening: does qualify; Completed n/a  Vision Screening: Recommended annual ophthalmology exams for early detection of glaucoma and other disorders of the eye. Is the patient up to date with their annual eye exam?  Yes  Who is the provider or what is the name of the office in which the patient attends annual eye exams? [facility], in [place]  If pt is not established with a provider, would they like to be referred to a provider to establish care? No .   Dental Screening: Recommended annual dental exams for proper oral hygiene  Community Resource Referral / Chronic Care Management: CRR required this visit?  No   CCM required this visit?  No      Plan:     I have personally reviewed and noted the following in the patient's chart:   Medical and social history Use of alcohol, tobacco or illicit drugs  Current medications and supplements including opioid prescriptions. Patient is not currently taking opioid prescriptions. Functional ability and status Nutritional status Physical activity Advanced directives List of other physicians Hospitalizations, surgeries, and ER visits in previous 12 months Vitals Screenings to include cognitive, depression, and falls Referrals and appointments  In addition, I have reviewed and discussed with patient certain preventive protocols, quality metrics, and best practice recommendations. A written personalized care plan for preventive services as well as general preventive health recommendations were provided to patient.     [place] Breeze, [place]   06/21/2021   Nurse Notes: provider notified.

## 2021-06-24 ENCOUNTER — Ambulatory Visit (INDEPENDENT_AMBULATORY_CARE_PROVIDER_SITE_OTHER): Payer: Medicare Other

## 2021-06-24 ENCOUNTER — Encounter (HOSPITAL_BASED_OUTPATIENT_CLINIC_OR_DEPARTMENT_OTHER): Payer: Self-pay | Admitting: Nurse Practitioner

## 2021-06-24 DIAGNOSIS — I480 Paroxysmal atrial fibrillation: Secondary | ICD-10-CM | POA: Diagnosis not present

## 2021-06-24 DIAGNOSIS — R413 Other amnesia: Secondary | ICD-10-CM

## 2021-06-24 DIAGNOSIS — F05 Delirium due to known physiological condition: Secondary | ICD-10-CM

## 2021-06-25 ENCOUNTER — Encounter: Payer: Medicare Other | Admitting: Psychology

## 2021-06-26 LAB — CUP PACEART REMOTE DEVICE CHECK
Date Time Interrogation Session: 20230612110128
Implantable Pulse Generator Implant Date: 20200528

## 2021-06-26 MED ORDER — DIVALPROEX SODIUM 125 MG PO DR TAB: 125.0000 mg | DELAYED_RELEASE_TABLET | Freq: Every day | ORAL | 1 refills | Status: DC

## 2021-07-09 ENCOUNTER — Encounter: Payer: Medicare Other | Admitting: Psychology

## 2021-07-10 NOTE — Progress Notes (Signed)
Carelink Summary Report / Loop Recorder 

## 2021-07-12 ENCOUNTER — Encounter: Payer: Medicare Other | Admitting: Psychology

## 2021-07-22 ENCOUNTER — Encounter: Payer: Medicare Other | Admitting: Psychology

## 2021-07-24 ENCOUNTER — Encounter (HOSPITAL_BASED_OUTPATIENT_CLINIC_OR_DEPARTMENT_OTHER): Payer: Self-pay

## 2021-07-24 NOTE — Telephone Encounter (Signed)
Will forward to anticoag clinic for review  

## 2021-07-25 ENCOUNTER — Other Ambulatory Visit: Payer: Self-pay | Admitting: *Deleted

## 2021-07-25 LAB — CUP PACEART REMOTE DEVICE CHECK
Date Time Interrogation Session: 20230711052539
Implantable Pulse Generator Implant Date: 20200528

## 2021-07-25 MED ORDER — APIXABAN 5 MG PO TABS: 5.0000 mg | ORAL_TABLET | Freq: Two times a day (BID) | ORAL | 1 refills | Status: AC

## 2021-07-25 MED ORDER — APIXABAN 5 MG PO TABS: 5.0000 mg | ORAL_TABLET | Freq: Two times a day (BID) | ORAL | 5 refills | Status: DC

## 2021-07-25 NOTE — Telephone Encounter (Signed)
Prescription refill request for Eliquis received.  Indication: afib  Last office visit: 04/03/2021 Scr: 0.97, 05/28/2021 Age: 71 yo  Weight: 80.3 kg   Pt is on the correct dose of Eliquis refill sent.

## 2021-07-29 ENCOUNTER — Ambulatory Visit (INDEPENDENT_AMBULATORY_CARE_PROVIDER_SITE_OTHER): Payer: Medicare Other

## 2021-07-29 DIAGNOSIS — I480 Paroxysmal atrial fibrillation: Secondary | ICD-10-CM | POA: Diagnosis not present

## 2021-08-06 ENCOUNTER — Encounter: Payer: Self-pay | Admitting: Physician Assistant

## 2021-08-07 ENCOUNTER — Encounter (HOSPITAL_BASED_OUTPATIENT_CLINIC_OR_DEPARTMENT_OTHER): Payer: Self-pay | Admitting: Nurse Practitioner

## 2021-08-07 NOTE — Telephone Encounter (Signed)
Mental competency is a legal term, needs to be done by a different physician:  Please contact Dr. Erick Blinks for assessment of decision mental capacity [phone] We do not do any legal paperwork for mental competency.

## 2021-08-13 ENCOUNTER — Encounter (HOSPITAL_BASED_OUTPATIENT_CLINIC_OR_DEPARTMENT_OTHER): Payer: Self-pay

## 2021-08-13 NOTE — Telephone Encounter (Signed)
MD not in office, please advise.

## 2021-08-21 ENCOUNTER — Encounter (HOSPITAL_BASED_OUTPATIENT_CLINIC_OR_DEPARTMENT_OTHER): Payer: Self-pay | Admitting: Nurse Practitioner

## 2021-08-22 ENCOUNTER — Encounter (HOSPITAL_BASED_OUTPATIENT_CLINIC_OR_DEPARTMENT_OTHER): Payer: Self-pay | Admitting: Nurse Practitioner

## 2021-08-29 LAB — CUP PACEART REMOTE DEVICE CHECK
Date Time Interrogation Session: 20230813052128
Implantable Pulse Generator Implant Date: 20200528

## 2021-08-29 NOTE — Progress Notes (Signed)
Carelink Summary Report / Loop Recorder 

## 2021-09-02 ENCOUNTER — Ambulatory Visit (INDEPENDENT_AMBULATORY_CARE_PROVIDER_SITE_OTHER): Payer: Medicare Other

## 2021-09-02 DIAGNOSIS — Z8673 Personal history of transient ischemic attack (TIA), and cerebral infarction without residual deficits: Secondary | ICD-10-CM | POA: Diagnosis not present

## 2021-09-24 ENCOUNTER — Encounter (HOSPITAL_BASED_OUTPATIENT_CLINIC_OR_DEPARTMENT_OTHER): Payer: Self-pay | Admitting: Nurse Practitioner

## 2021-09-30 NOTE — Progress Notes (Signed)
Carelink Summary Report / Loop Recorder 

## 2021-10-01 ENCOUNTER — Encounter: Payer: Self-pay | Admitting: Physician Assistant

## 2021-10-02 ENCOUNTER — Other Ambulatory Visit (HOSPITAL_BASED_OUTPATIENT_CLINIC_OR_DEPARTMENT_OTHER): Payer: Self-pay | Admitting: Nurse Practitioner

## 2021-10-02 ENCOUNTER — Other Ambulatory Visit: Payer: Self-pay | Admitting: Physician Assistant

## 2021-10-02 DIAGNOSIS — R413 Other amnesia: Secondary | ICD-10-CM

## 2021-10-02 DIAGNOSIS — F05 Delirium due to known physiological condition: Secondary | ICD-10-CM

## 2021-10-02 DIAGNOSIS — F03911 Unspecified dementia, unspecified severity, with agitation: Secondary | ICD-10-CM

## 2021-10-02 MED ORDER — QUETIAPINE FUMARATE 25 MG PO TABS: 25.0000 mg | ORAL_TABLET | Freq: Every day | ORAL | 1 refills | Status: DC

## 2021-10-02 NOTE — Telephone Encounter (Signed)
Agree with PCP starting Seroquel. See how he does with this, and if works, will keep same appt in Nov. Otherwise we will reassess a little earlier, as PCP is managing the mood issues while we are assessing memory, thanks

## 2021-10-07 ENCOUNTER — Ambulatory Visit (INDEPENDENT_AMBULATORY_CARE_PROVIDER_SITE_OTHER): Payer: Medicare Other

## 2021-10-07 DIAGNOSIS — I480 Paroxysmal atrial fibrillation: Secondary | ICD-10-CM | POA: Diagnosis not present

## 2021-10-08 LAB — CUP PACEART REMOTE DEVICE CHECK
Date Time Interrogation Session: 20230925140523
Implantable Pulse Generator Implant Date: 20200528

## 2021-10-23 NOTE — Progress Notes (Signed)
Carelink Summary Report / Loop Recorder 

## 2021-10-25 ENCOUNTER — Other Ambulatory Visit (HOSPITAL_BASED_OUTPATIENT_CLINIC_OR_DEPARTMENT_OTHER): Payer: Self-pay | Admitting: Nurse Practitioner

## 2021-10-25 DIAGNOSIS — F03911 Unspecified dementia, unspecified severity, with agitation: Secondary | ICD-10-CM

## 2021-10-25 DIAGNOSIS — F05 Delirium due to known physiological condition: Secondary | ICD-10-CM

## 2021-11-11 ENCOUNTER — Ambulatory Visit: Payer: Medicare Other | Attending: Cardiology

## 2021-11-11 DIAGNOSIS — Z8673 Personal history of transient ischemic attack (TIA), and cerebral infarction without residual deficits: Secondary | ICD-10-CM | POA: Diagnosis not present

## 2021-11-12 ENCOUNTER — Telehealth: Payer: Self-pay

## 2021-11-12 LAB — CUP PACEART REMOTE DEVICE CHECK
Date Time Interrogation Session: 20231027230850
Implantable Pulse Generator Implant Date: 20200528

## 2021-11-12 NOTE — Telephone Encounter (Signed)
Received the following transmission alert from [employer]:  ILR alert for tachy episode, 8 episodes logged. 1 EGM downloaded showing NCT, 8 secs with avg rates 180's. Will need manual transmission for further review of 7 other episodes. Routing for further review/management. - JJB  Note: Patient does not have the sx activator button and I cannot access a remote transmission to view the other 7 episodes.  (Appears the total of 8 episodes have occurred since 10/28 as report on that day reports hx of 0 Tachy episodes):   Spoke with wife (okay per DPR), she states that the only changes are the following for patient:  Patient started Depakote and Seraquel in the past few months Husband has progressing dementia and has shown a significant increase in anxiety and agitation over past month as well.  Otherwise wife cannot think of any noted differences.   Will forward to Dr. Curt Bears for further review and considerations:

## 2021-11-15 ENCOUNTER — Ambulatory Visit (INDEPENDENT_AMBULATORY_CARE_PROVIDER_SITE_OTHER): Payer: Medicare Other | Admitting: Physician Assistant

## 2021-11-15 ENCOUNTER — Encounter: Payer: Self-pay | Admitting: Physician Assistant

## 2021-11-15 VITALS — BP 137/84 | HR 107 | Resp 20 | Ht 71.0 in | Wt 183.0 lb

## 2021-11-15 DIAGNOSIS — F01C0 Vascular dementia, severe, without behavioral disturbance, psychotic disturbance, mood disturbance, and anxiety: Secondary | ICD-10-CM

## 2021-11-15 NOTE — Patient Instructions (Addendum)
It was a pleasure to see you today at our office.   Recommendations:  Meds: Follow up in  6 months Continue Memantine 10 mg two times  a day  Continue mood medications as per primary doctor  Agree with ADP  Feel free to visit Facebook page " Insp[employer]" for tips of how to care for people with memory problems.   [employer] of medicine from the [employer] of health) has a Management consultant and public funded clinical studies conducted around the world.  Here is the link:  http://saunders.com/    RECOMMENDATIONS FOR ALL PATIENTS WITH MEMORY PROBLEMS: 1. Continue to exercise (Recommend 30 minutes of walking everyday, or 3 hours every week) 2. Increase social interactions - continue going to [place] and enjoy social gatherings with friends and family 3. Eat healthy, avoid fried foods and eat more fruits and vegetables 4. Maintain adequate blood pressure, blood sugar, and blood cholesterol level. Reducing the risk of stroke and cardiovascular disease also helps promoting better memory. 5. Avoid stressful situations. Live a simple life and avoid aggravations. Organize your time and prepare for the next day in anticipation. 6. Sleep well, avoid any interruptions of sleep and avoid any distractions in the bedroom that may interfere with adequate sleep quality 7. Avoid sugar, avoid sweets as there is a strong link between excessive sugar intake, diabetes, and cognitive impairment We discussed the Mediterranean diet, which has been shown to help patients reduce the risk of progressive memory disorders and reduces cardiovascular risk. This includes eating fish, eat fruits and green leafy vegetables, nuts like almonds and hazelnuts, walnuts, and also use olive oil. Avoid fast foods and fried foods as much as possible. Avoid sweets and sugar as sugar use has been linked to worsening of memory function.  There is always a concern of gradual progression of memory problems. If this is  the case, then we may need to adjust level of care according to patient needs. Support, both to the patient and caregiver, should then be put into place.    The Alzheimer's Association is here all day, every day for people facing Alzheimer's disease through our free 24/7 Helpline: 856-397-2088. The Helpline provides reliable information and support to all those who need assistance, such as individuals living with memory loss, Alzheimer's or other dementia, caregivers, health care professionals and the public.  Our highly trained and knowledgeable staff can help you with: Understanding memory loss, dementia and Alzheimer's  Medications and other treatment options  General information about aging and brain health  Skills to provide quality care and to find the best care from professionals  Legal, financial and living-arrangement decisions Our Helpline also features: Confidential care consultation provided by master's level clinicians who can help with decision-making support, crisis assistance and education on issues families face every day  Help in a caller's preferred language using our translation service that features more than 200 languages and dialects  Referrals to local community programs, services and ongoing support     FALL PRECAUTIONS: Be cautious when walking. Scan the area for obstacles that may increase the risk of trips and falls. When getting up in the mornings, sit up at the edge of the bed for a few minutes before getting out of bed. Consider elevating the bed at the head end to avoid drop of blood pressure when getting up. Walk always in a well-lit room (use night lights in the walls). Avoid area rugs or power cords from appliances in the middle  of the walkways. Use a walker or a cane if necessary and consider physical therapy for balance exercise. Get your eyesight checked regularly.  FINANCIAL OVERSIGHT: Supervision, especially oversight when making financial decisions or  transactions is also recommended.  HOME SAFETY: Consider the safety of the kitchen when operating appliances like stoves, microwave oven, and blender. Consider having supervision and share cooking responsibilities until no longer able to participate in those. Accidents with firearms and other hazards in the house should be identified and addressed as well.   ABILITY TO BE LEFT ALONE: If patient is unable to contact 911 operator, consider using LifeLine, or when the need is there, arrange for someone to stay with patients. Smoking is a fire hazard, consider supervision or cessation. Risk of wandering should be assessed by caregiver and if detected at any point, supervision and safe proof recommendations should be instituted.  MEDICATION SUPERVISION: Inability to self-administer medication needs to be constantly addressed. Implement a mechanism to ensure safe administration of the medications.     Mediterranean Diet A Mediterranean diet refers to food and lifestyle choices that are based on the traditions of countries located on the [employer]. This way of eating has been shown to help prevent certain conditions and improve outcomes for people who have chronic diseases, like kidney disease and heart disease. What are tips for following this plan? Lifestyle  Cook and eat meals together with your family, when possible. Drink enough fluid to keep your urine clear or pale yellow. Be physically active every day. This includes: Aerobic exercise like running or swimming. Leisure activities like gardening, walking, or housework. Get 7-8 hours of sleep each night. If recommended by your health care provider, drink red wine in moderation. This means 1 glass a day for nonpregnant women and 2 glasses a day for men. A glass of wine equals 5 oz (150 mL). Reading food labels  Check the serving size of packaged foods. For foods such as rice and pasta, the serving size refers to the amount of cooked  product, not dry. Check the total fat in packaged foods. Avoid foods that have saturated fat or trans fats. Check the ingredients list for added sugars, such as corn syrup. Shopping  At the grocery store, buy most of your food from the areas near the walls of the store. This includes: Fresh fruits and vegetables (produce). Grains, beans, nuts, and seeds. Some of these may be available in unpackaged forms or large amounts (in bulk). Fresh seafood. Poultry and eggs. Low-fat dairy products. Buy whole ingredients instead of prepackaged foods. Buy fresh fruits and vegetables in-season from local farmers markets. Buy frozen fruits and vegetables in resealable bags. If you do not have access to quality fresh seafood, buy precooked frozen shrimp or canned fish, such as tuna, salmon, or sardines. Buy small amounts of raw or cooked vegetables, salads, or olives from the deli or salad bar at your store. Stock your pantry so you always have certain foods on hand, such as olive oil, canned tuna, canned tomatoes, rice, pasta, and beans. Cooking  Cook foods with extra-virgin olive oil instead of using butter or other vegetable oils. Have meat as a side dish, and have vegetables or grains as your main dish. This means having meat in small portions or adding small amounts of meat to foods like pasta or stew. Use beans or vegetables instead of meat in common dishes like chili or lasagna. Experiment with different cooking methods. Try roasting or broiling vegetables instead of  steaming or sauteing them. Add frozen vegetables to soups, stews, pasta, or rice. Add nuts or seeds for added healthy fat at each meal. You can add these to yogurt, salads, or vegetable dishes. Marinate fish or vegetables using olive oil, lemon juice, garlic, and fresh herbs. Meal planning  Plan to eat 1 vegetarian meal one day each week. Try to work up to 2 vegetarian meals, if possible. Eat seafood 2 or more times a week. Have  healthy snacks readily available, such as: Vegetable sticks with hummus. Greek yogurt. Fruit and nut trail mix. Eat balanced meals throughout the week. This includes: Fruit: 2-3 servings a day Vegetables: 4-5 servings a day Low-fat dairy: 2 servings a day Fish, poultry, or lean meat: 1 serving a day Beans and legumes: 2 or more servings a week Nuts and seeds: 1-2 servings a day Whole grains: 6-8 servings a day Extra-virgin olive oil: 3-4 servings a day Limit red meat and sweets to only a few servings a month What are my food choices? Mediterranean diet Recommended Grains: Whole-grain pasta. Brown rice. Bulgar wheat. Polenta. Couscous. Whole-wheat bread. [place]. Vegetables: Artichokes. Beets. Broccoli. Cabbage. Carrots. Eggplant. Green beans. Chard. Kale. Spinach. Onions. Leeks. Peas. Squash. Tomatoes. Peppers. Radishes. Fruits: Apples. Apricots. Avocado. Berries. Bananas. Cherries. Dates. Figs. Grapes. Lemons. Melon. Oranges. Peaches. Plums. Pomegranate. Meats and other protein foods: Beans. Almonds. Sunflower seeds. Pine nuts. Peanuts. [place]. Salmon. Scallops. Shrimp. [place]. Tilapia. Clams. Oysters. Eggs. Dairy: Low-fat milk. Cheese. Greek yogurt. Beverages: Water. Red wine. Herbal tea. Fats and oils: Extra virgin olive oil. Avocado oil. Grape seed oil. Sweets and desserts: [place] yogurt with honey. Baked apples. Poached pears. Trail mix. Seasoning and other foods: Basil. Cilantro. Coriander. Cumin. Mint. Parsley. Sage. Rosemary. Tarragon. Garlic. Oregano. Thyme. Pepper. Balsalmic vinegar. Tahini. Hummus. Tomato sauce. Olives. Mushrooms. Limit these Grains: Prepackaged pasta or rice dishes. Prepackaged cereal with added sugar. Vegetables: Deep fried potatoes (french fries). Fruits: Fruit canned in syrup. Meats and other protein foods: Beef. Pork. Lamb. Poultry with skin. Hot dogs. [place] Salines. Dairy: Ice cream. Sour cream. Whole milk. Beverages: Juice. Sugar-sweetened soft drinks.  Beer. Liquor and spirits. Fats and oils: Butter. Canola oil. Vegetable oil. Beef fat (tallow). Lard. Sweets and desserts: Cookies. Cakes. Pies. Candy. Seasoning and other foods: Mayonnaise. Premade sauces and marinades. The items listed may not be a complete list. Talk with your dietitian about what dietary choices are right for you. Summary The Mediterranean diet includes both food and lifestyle choices. Eat a variety of fresh fruits and vegetables, beans, nuts, seeds, and whole grains. Limit the amount of red meat and sweets that you eat. Talk with your health care provider about whether it is safe for you to drink red wine in moderation. This means 1 glass a day for nonpregnant women and 2 glasses a day for men. A glass of wine equals 5 oz (150 mL). This information is not intended to replace advice given to you by your health care provider. Make sure you discuss any questions you have with your health care provider. Document Released: 08/23/2015 Document Revised: 09/25/2015 Document Reviewed: 08/23/2015 Elsevier Interactive Patient Education  2017 [employer].

## 2021-11-15 NOTE — Progress Notes (Signed)
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 RH male seen today in follow up for memory loss.   MRI brain 04/06/21 with significant chronic vascular disease, including chronic RACA infarct, and prominent cerebellar involvement. LP 04/15/2021 remarkable for 14.3.3 6223, [id number], pTAU 0.042 ( high) consistent with the presence of pathological changes associated with Alzheimer's disease. Other labs were unremarkable. He is on memantine 10 mg bid, tolerating well.  Mood control as per PCP: He is currently on Depakote 125 mg nightly and Seroquel 25 mg nightly.  Unfortunately, progression of the disease is noted in today's visit, but adding new agents as for example Aricept, would not be of therapeutic benefit at this stage.  Wife is in agreement.   Recommendations  Follow up in  6 months. Continue 24/7 monitoring for safety Continue to control mood as per PCP, continue Seroquel and Depakote Recommend good control of cardiovascular risk factors.   Agree with adult day program    Subjective:    This patient is accompanied in the office by his wife  who supplements the history.  Previous records as well as any outside records available were reviewed prior to todays visit.    Any changes in memory since last visit?  "Disease has progressed ".  Sometimes he does not recognize his wife, and other times he thinks his wife is 2 different people now.  She tried simple activities like puzzles, but now he does not like to do them. repeats oneself?  Endorsed Disoriented when walking into a room? Endorsed.  "More and more he confuses rooms, such as bathroom with bedroom. Sometimes he does not recognize his own house as his home ". Leaving objects in unusual places?  Endorsed.  Ambulates  with difficulty?   Endorsed, hesitates to get up from chair . He shuffles but steady on his feet. A lot of pacing and muttering  around the house. He has more difficulty with ADLs than prior.  His wife is entertaining [facility] adult day program for him to have some activities during the day. Recent falls?  Patient denies   Any head injuries?  Patient denies   History of seizures?   Patient denies   Wandering behavior?  Patient denies.  "During the period of agitation, he may threaten to walk out of the house, but he never does ". Patient drives?   Patient no longer drives  Any mood changes since last visit?  Endorsed.  At night, there are no periods of agitation anymore.  However, on a trip to [place], he had a "meltdown upon the return because he became concerned how he was going to return to [place] ". Any worsening depression?: Possible  "especially when he is aware of his cognitive status.  Hallucinations?  Patient denies "not really hallucinating, more like confabulating " Paranoia?  Patient denies   Patient reports that sleeps well without vivid dreams, REM behavior or sleepwalking  He sleeps better , Seroquel helps  History of sleep apnea?  Patient denies   Any hygiene concerns?  Endorsed.  He confuses the soap and shampoo and easier for his wife, once or twice a week to take a shower together and in the interim, he uses the bath wipes Independent of bathing and dressing?  Needs assistance, as he is less steady on his feet.  Does the patient needs help with medications? Wife in charge  Who is in  charge of the finances?   Wife is in charge   Any changes in appetite?  He eats normally, although he does not know how to use a fork and a knife.  She cuts small pieces for him, Entresto given also soft foods.  But "he is not drinking water ". He does drink 3 cups of coffee a day.    Patient have trouble swallowing? Patient denies   Does the patient cook?  Patient denies   Any kitchen accidents such as leaving the stove on? Patient denies   Any headaches?  Patient denies   Double vision? Patient denies    Any focal numbness or tingling?  Patient denies   Chronic back pain Patient denies   Unilateral weakness?  Patient denies   Any tremors?  Patient denies   Any history of anosmia?  Patient denies   Any incontinence of urine?  Patient denies  but lost ability to cleanse himself after a bowel movement  Any bowel dysfunction?   Patient denies      Patient lives with: wife  Initial visit 03/2021. The patient is seen in neurologic consultation at the request of Early, Coralee Pesa, NP for the evaluation of memory.  The patient is accompanied by his wife who supplements the history.a pleasant 71 year old man who recently relocated to this area to establish care in view of the diagnosis of dementia.   About 3 years ago, the patient suffered a CVA while in [place], found at the time that he had experienced several TIAs prior, without residual weaknessIitially he was "doing well 1 year after his CVA, except for some issues with his short-term memory, then beginning to show decline ".  In review, back in March of last year, his wife said he did not recognize her "several episodes like that"  "Most of the time he is fairly okay, but they are increasing moments of disorientation ".Sometimes he does not recognize the house that he is in, thinking [place] is [place] prior home.  He is also unable to do some home repairs that he used to do before. 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  Sometimes he asked his wife "where is my wife?  ".His confusion may last most of the day.  Recently, he was going to take his dog for a walk, but he only took the leash with him, "and forgot the dog ". He also does not know how to use the fork properly anymore.  There are some hygiene concerns, such as not wishing to take a shower.  He asked for "can I take it another time? ".The morning of this visit, he was needing help to dress up, he forgot how to put the belt on. He leaves objects  in different places, for example not realizing after eating ice cream to put it back in the freezer, and instead only putting the lid. He ambulates without difficulty, denies any recent falls or head injuries. He sleeps fairly well unless he has to get up to go to the bathroom.  His wife is in charge of the medications, as well as finances (she was a Customer service manager before).  He currently denies any headaches, double vision, dizziness, focal numbness or tingling, unilateral weakness, tremors or anosmia or seizures.  Denies urine incontinence, retention, constipation or diarrhea.  Denies sleep apnea, alcohol or tobacco.  Family history significant for mother with Alzheimer's disease, he has sisters  and uncles with dementia.headaches, double vision, dizziness, focal numbness or tingling, unilateral weakness, tremors or anosmia. No history of seizures. Denies urine incontinence, retention, constipation or diarrhea.  Denies OSA, ETOH or Tobacco.      PREVIOUS MEDICATIONS:   CURRENT MEDICATIONS:  Outpatient Encounter Medications as of 11/15/2021  Medication Sig   apixaban (ELIQUIS) 5 MG TABS tablet Take 1 tablet (5 mg total) by mouth 2 (two) times daily.   cholecalciferol (VITAMIN D) 25 MCG (1000 UNIT) tablet Take by mouth.   divalproex (DEPAKOTE) 125 MG DR tablet TAKE 1 TABLET (125 MG TOTAL) BY MOUTH AT BEDTIME   memantine (NAMENDA) 10 MG tablet TAKE 1 TABLET BY MOUTH TWICE A DAY   Multiple Vitamins-Minerals (CENTRUM SILVER PO) Take by mouth.   QUEtiapine (SEROQUEL) 25 MG tablet TAKE 1 TABLET BY MOUTH EVERYDAY AT BEDTIME   rosuvastatin (CRESTOR) 20 MG tablet Take 1 tablet (20 mg total) by mouth daily.   XIIDRA 5 % SOLN Apply 1 drop to eye 2 (two) times daily.   No facility-administered encounter medications on file as of 11/15/2021.        No data to display            03/13/2021    9:00 PM  Montreal Cognitive Assessment   Visuospatial/ Executive (0/5) 0  Naming (0/3) 1  Attention: Read list of  digits (0/2) 2  Attention: Read list of letters (0/1) 0  Attention: Serial 7 subtraction starting at 100 (0/3) 0  Language: Repeat phrase (0/2) 0  Language : Fluency (0/1) 0  Abstraction (0/2) 0  Delayed Recall (0/5) 0  Orientation (0/6) 1  Total 4  Adjusted Score (based on education) 4    Objective:     PHYSICAL EXAMINATION:    VITALS:   Vitals:   11/15/21 1055  BP: 137/84  Pulse: (!) 107  Resp: 20  SpO2: 98%  Weight: 183 lb (83 kg)  Height: 5\' 11"  (1.803 m)    GEN:  The patient appears stated age and is in NAD. HEENT:  Normocephalic, atraumatic.   Neurological examination:  General: NAD, well-groomed, appears stated age. Orientation: The patient is alert. Oriented to person, not to place or date Cranial nerves: There is good facial symmetry.The speech is fluent and clear. No aphasia or dysarthria. Fund of knowledge is reduced. Recent and remote memory are impaired. Attention and concentration are reduced.  Unable to name objects and repeat phrases.  Hearing is intact to conversational tone.    Sensation: Sensation is intact to light touch throughout Motor: Strength is at least antigravity x4. Tremors: none  DTR's 2/4 in UE/LE     Movement examination: Tone: There is normal tone in the UE/LE Abnormal movements: very mild tremor B, no cogwheeling.  No myoclonus.  No asterixis.   Coordination:  There is some decremation with RAM's. Normal finger to nose  Gait and Station: The patient has no difficulty arising out of a deep-seated chair without the use of the hands. The patient's stride length is good.  Gait is cautious and narrow.    Thank you for allowing the opportunity to participate in the care of this nice patient. Please do not hesitate to contact us for any questions or concerns.   Total time spent on today's visit was 42 minutes dedicated to this patient today, preparing to see patient, examining the patient, ordering tests and/or medications and  counseling the patient, documenting clinical information in the EHR or other health record, independently  interpreting results and communicating results to the patient/family, discussing treatment and goals, answering patient's questions and coordinating care.  Cc:  Tollie Eth, NP  Marlowe Kays 11/15/2021 12:15 PM

## 2021-11-28 ENCOUNTER — Encounter (HOSPITAL_BASED_OUTPATIENT_CLINIC_OR_DEPARTMENT_OTHER): Payer: Self-pay | Admitting: Nurse Practitioner

## 2021-11-28 ENCOUNTER — Ambulatory Visit (INDEPENDENT_AMBULATORY_CARE_PROVIDER_SITE_OTHER): Payer: Medicare Other | Admitting: Nurse Practitioner

## 2021-11-28 VITALS — BP 145/100 | HR 94 | Temp 98.0°F

## 2021-11-28 DIAGNOSIS — F03911 Unspecified dementia, unspecified severity, with agitation: Secondary | ICD-10-CM | POA: Diagnosis not present

## 2021-11-28 DIAGNOSIS — E785 Hyperlipidemia, unspecified: Secondary | ICD-10-CM

## 2021-11-28 DIAGNOSIS — I10 Essential (primary) hypertension: Secondary | ICD-10-CM

## 2021-11-28 DIAGNOSIS — F05 Delirium due to known physiological condition: Secondary | ICD-10-CM

## 2021-11-28 DIAGNOSIS — M545 Low back pain, unspecified: Secondary | ICD-10-CM

## 2021-11-28 DIAGNOSIS — F01C0 Vascular dementia, severe, without behavioral disturbance, psychotic disturbance, mood disturbance, and anxiety: Secondary | ICD-10-CM | POA: Diagnosis not present

## 2021-11-28 DIAGNOSIS — R413 Other amnesia: Secondary | ICD-10-CM | POA: Diagnosis not present

## 2021-11-28 DIAGNOSIS — G8929 Other chronic pain: Secondary | ICD-10-CM

## 2021-11-28 DIAGNOSIS — I480 Paroxysmal atrial fibrillation: Secondary | ICD-10-CM

## 2021-11-28 MED ORDER — QUETIAPINE FUMARATE 25 MG PO TABS: 25.0000 mg | ORAL_TABLET | Freq: Two times a day (BID) | ORAL | 1 refills | Status: DC

## 2021-11-28 MED ORDER — DIVALPROEX SODIUM 125 MG PO DR TAB: 125.0000 mg | DELAYED_RELEASE_TABLET | Freq: Two times a day (BID) | ORAL | 1 refills | Status: AC

## 2021-11-28 NOTE — Progress Notes (Signed)
Shawna Clamp, DNP, AGNP-c [facility] [address] [place], [place] [address] [phone] Office [phone] Fax  ESTABLISHED PATIENT- Chronic Health and/or Follow-Up Visit  Blood pressure (!) 145/100, pulse 94, temperature 98 F (36.7 C), SpO2 98 %.    Steven Oliver is a 71 y.o. year old male presenting today for evaluation and management of the following: Follow-up (Not drinking may fluids at all, trouble having bowel movements/) and Back Pain With his wife, [name], today who provides a history Chronic anticoagulation No signs of bleeding. He does bruise easily, but she has soken with cardiology about this and reassured. No blood in stool or urine  Stool He is not drinking without promoting and his stool has become soft and pasty. No hard stools present and no significant straining, but she expresses concerns for dehydration and worsening constipation.  They are starting with a caregiver once a week next week. [name] tells me that she is still going to be with him the majority of the time, but she is looking forward to the break  Back pain [name] has noticed that his back is bothering him when he goes to stand or sit. He tells me that this has been present for a long time, but andrea tellsme that she notices it more now. He does walk the dog twice a day for about 15 minutes at a time, but overall he is sedentary otherwise.   All ROS negative with exception of what is listed above.   PHYSICAL EXAM Physical Exam Vitals and nursing note reviewed.  Constitutional:      General: He is not in acute distress.    Appearance: He is not ill-appearing.  HENT:     Head: Normocephalic.  Eyes:     Pupils: Pupils are equal, round, and reactive to light.  Neck:     Vascular: No carotid bruit.  Cardiovascular:     Rate and Rhythm: Normal rate and regular rhythm.     Pulses: Normal pulses.     Heart sounds: Normal heart sounds.   Pulmonary:     Effort: Pulmonary effort is normal.     Breath sounds: Normal breath sounds.  Abdominal:     General: Abdomen is flat. Bowel sounds are normal. There is no distension.     Palpations: Abdomen is soft.     Tenderness: There is no abdominal tenderness. There is no right CVA tenderness, left CVA tenderness, guarding or rebound.  Musculoskeletal:        General: Tenderness present.     Cervical back: Normal range of motion. No tenderness.     Comments: Tenderness present to lumbar/sacral spine with evidence of pain when standing and sitting. LE weakness present at baseline.  Lymphadenopathy:     Cervical: No cervical adenopathy.  Skin:    General: Skin is warm and dry.     Capillary Refill: Capillary refill takes less than 2 seconds.  Neurological:     Mental Status: He is alert. Mental status is at baseline.     Gait: Gait abnormal.     PLAN Problem List Items Addressed This Visit     Dyslipidemia    Labs today. No changes at this time.       Relevant Orders   CBC with Differential/Platelet (Completed)   Comprehensive metabolic panel (Completed)   Lipid panel (Completed)   TSH (Completed)   PAF (paroxysmal atrial fibrillation) (HCC)    HRRR in office today. No alarm sx  are present. No changes to plan of care at this time.      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 Unfortunately, it appears that his thirst center is being affected resulting in reduced intake. This can lead to multiple issues, therefore I do recommend that we start with intervention. Recommend trial of placing water throughout the house where he typically spends time to help visually prompt him. It may be helpful to set  an alarm to remind him to take a few drinks every hour. He will likely continue to need prompting, therefore, it is important to monitor the amount of intake he has daily. Consider use of colace to help keep stools soft.       Relevant Medications   QUEtiapine (SEROQUEL) 25 MG tablet   divalproex (DEPAKOTE) 125 MG DR tablet   Primary hypertension    Chronic. BP is quite elevated in the office today. There are no alarm symptoms present. I suspect this was an abnormal reading, unfortunately, it does not appear a repeat evaluation was made prior to leaving. I recommend monitoring at home monitoring of his BP to ensure that he is not chronically running high. If BP is > 140/90 consistently, recommend following up. We will monitor labs today.       Chronic low back pain    Lumbar pain with increased difficulty with sitting and standing. This could partially be due to decreased movement, but there may be pathological presence also. Recommend gentle stretching exercises and work on increasing activity in short bursts to see if this is helpful. May use voltaren gel topically or tylenol to help with pain.       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 Burnard Bunting- Dementia.   Worthy Keeler, DNP, AGNP-c 11/28/2021  9:49 AM

## 2021-11-28 NOTE — Patient Instructions (Addendum)
I do recommend increased fiber intake and water intake in the diet to help with the thicker stools. The mandarin oranges are a wonderful source of increased fiber and fluids. You can consider something like colace to help keep the stools soft. You could also consider adding something like metamucil or benefiber to help with increased fiber intake.  Keeping water out and in several places around the house to help prompt increased water intake.   You can try to set an alarm on a phone or other device to prompt for increased water intake.   You can consider using something like biofreeze or icy hot on the low back to see if this helps with some of the back pain. A lidocaine patch can be placed and will stay in place for 12 hours and this can be very helpful. You can also try voltaren gel to the area. Tylenol would be another option to try to help with the low back pain.   I have sent in the increased doses of seroquel and depakote for you. Please let Maralyn Sago or myself know if this seems to cause any issues.    Keep one DNR and MOST form on the refrigerator and one in a secure area. It is important to keep a folder with all of this paperwork in an easy and accessible location. Some people keep this in the freezer in a plastic bag so in an emergency you don't have to think about it.

## 2021-11-29 LAB — COMPREHENSIVE METABOLIC PANEL
ALT: 18 IU/L (ref 0–44)
AST: 19 IU/L (ref 0–40)
Albumin/Globulin Ratio: 1.7 (ref 1.2–2.2)
Albumin: 4.5 g/dL (ref 3.8–4.8)
Alkaline Phosphatase: 82 IU/L (ref 44–121)
BUN/Creatinine Ratio: 19 (ref 10–24)
BUN: 20 mg/dL (ref 8–27)
Bilirubin Total: 0.3 mg/dL (ref 0.0–1.2)
CO2: 24 mmol/L (ref 20–29)
Calcium: 9.7 mg/dL (ref 8.6–10.2)
Chloride: 105 mmol/L (ref 96–106)
Creatinine, Ser: 1.03 mg/dL (ref 0.76–1.27)
Globulin, Total: 2.6 g/dL (ref 1.5–4.5)
Glucose: 99 mg/dL (ref 70–99)
Potassium: 4.2 mmol/L (ref 3.5–5.2)
Sodium: 144 mmol/L (ref 134–144)
Total Protein: 7.1 g/dL (ref 6.0–8.5)
eGFR: 78 mL/min/{1.73_m2} (ref >59)

## 2021-11-29 LAB — CBC WITH DIFFERENTIAL/PLATELET
Basophils Absolute: 0 10*3/uL (ref 0.0–0.2)
Basos: 1 %
EOS (ABSOLUTE): 0.2 10*3/uL (ref 0.0–0.4)
Eos: 2 %
Hematocrit: 45.8 % (ref 37.5–51.0)
Hemoglobin: 14.7 g/dL (ref 13.0–17.7)
Immature Grans (Abs): 0 10*3/uL (ref 0.0–0.1)
Immature Granulocytes: 0 %
Lymphocytes Absolute: 1.4 10*3/uL (ref 0.7–3.1)
Lymphs: 17 %
MCH: 29.1 pg (ref 26.6–33.0)
MCHC: 32.1 g/dL (ref 31.5–35.7)
MCV: 91 fL (ref 79–97)
Monocytes Absolute: 0.6 10*3/uL (ref 0.1–0.9)
Monocytes: 7 %
Neutrophils Absolute: 6.3 10*3/uL (ref 1.4–7.0)
Neutrophils: 73 %
Platelets: 231 10*3/uL (ref 150–450)
RBC: 5.05 x10E6/uL (ref 4.14–5.80)
RDW: 13.5 % (ref 11.6–15.4)
WBC: 8.5 10*3/uL (ref 3.4–10.8)

## 2021-11-29 LAB — LIPID PANEL
Chol/HDL Ratio: 2.4 ratio (ref 0.0–5.0)
Cholesterol, Total: 131 mg/dL (ref 100–199)
HDL: 54 mg/dL (ref >39)
LDL Chol Calc (NIH): 64 mg/dL (ref 0–99)
Triglycerides: 60 mg/dL (ref 0–149)
VLDL Cholesterol Cal: 13 mg/dL (ref 5–40)

## 2021-11-29 LAB — TSH: TSH: 1.66 u[IU]/mL (ref 0.450–4.500)

## 2021-12-03 ENCOUNTER — Telehealth: Payer: Self-pay

## 2021-12-03 NOTE — Telephone Encounter (Signed)
ILR @ [facility] 12/02/21. Spoke to patients wife [name].  Advised ILR battery has died and discussed options to leave in or explant. States patient would like to have device explanted. Patient will also bring remote monitor in for [place] to ship back. Advised a scheduler will contact them for apt for explant. Voiced understanding.

## 2021-12-14 NOTE — Progress Notes (Signed)
Carelink Summary Report / Loop Recorder 

## 2022-01-01 ENCOUNTER — Encounter: Payer: Self-pay | Admitting: Physician Assistant

## 2022-01-12 DIAGNOSIS — G8929 Other chronic pain: Secondary | ICD-10-CM | POA: Insufficient documentation

## 2022-01-12 NOTE — Assessment & Plan Note (Signed)
Labs today. No changes at this time.

## 2022-01-12 NOTE — Assessment & Plan Note (Signed)
Lumbar pain with increased difficulty with sitting and standing. This could partially be due to decreased movement, but there may be pathological presence also. Recommend gentle stretching exercises and work on increasing activity in short bursts to see if this is helpful. May use voltaren gel topically or tylenol to help with pain.

## 2022-01-12 NOTE — Assessment & Plan Note (Signed)
HRRR in office today. No alarm sx are present. No changes to plan of care at this time.

## 2022-01-12 NOTE — Assessment & Plan Note (Signed)
Chronic. BP is quite elevated in the office today. There are no alarm symptoms present. I suspect this was an abnormal reading, unfortunately, it does not appear a repeat evaluation was made prior to leaving. I recommend monitoring at home monitoring of his BP to ensure that he is not chronically running high. If BP is > 140/90 consistently, recommend following up. We will monitor labs today.

## 2022-01-12 NOTE — Assessment & Plan Note (Signed)
Chronic. Unfortunately, it appears that his thirst center is being affected resulting in reduced intake. This can lead to multiple issues, therefore I do recommend that we start with intervention. Recommend trial of placing water throughout the house where he typically spends time to help visually prompt him. It may be helpful to set an alarm to remind him to take a few drinks every hour. He will likely continue to need prompting, therefore, it is important to monitor the amount of intake he has daily. Consider use of colace to help keep stools soft.

## 2022-01-15 ENCOUNTER — Ambulatory Visit: Payer: Medicare Other | Attending: Cardiology | Admitting: Cardiology

## 2022-01-15 ENCOUNTER — Encounter: Payer: Self-pay | Admitting: Cardiology

## 2022-01-15 VITALS — BP 146/84 | HR 104 | Ht 72.0 in | Wt 187.0 lb

## 2022-01-15 DIAGNOSIS — I480 Paroxysmal atrial fibrillation: Secondary | ICD-10-CM | POA: Diagnosis not present

## 2022-01-15 DIAGNOSIS — D6869 Other thrombophilia: Secondary | ICD-10-CM

## 2022-01-15 NOTE — Patient Instructions (Signed)
Medication Instructions:  Your physician recommends that you continue on your current medications as directed. Please refer to the Current Medication list given to you today.  Labwork: None ordered.  Testing/Procedures: None ordered.  Follow-Up:  Your physician wants you to follow-up as needed with Dr. Camnitz.    Implantable Loop Recorder Removal, Care After This sheet gives you information about how to care for yourself after your procedure. Your health care provider may also give you more specific instructions. If you have problems or questions, contact your health care provider. What can I expect after the procedure? After the procedure, it is common to have: Soreness or discomfort near the incision. Some swelling or bruising near the incision.  Follow these instructions at home: Incision care  Monitor your cardiac device site for redness, swelling, and drainage. Call the device clinic at [phone] if you experience these symptoms or fever/chills.  Keep the large square bandage on your site for 24 hours and then you may remove it yourself. Keep the steri-strips underneath in place.   You may shower after 72 hours / 3 days from your procedure with the steri-strips in place. They will usually fall off on their own, or may be removed after 10 days. Pat dry.   Avoid lotions, ointments, or perfumes over your incision until it is well-healed.  Please do not submerge in water until your site is completely healed.   If your wound site starts to bleed apply pressure.       If you have any questions/concerns please call the device clinic at [phone].  Activity  Return to your normal activities.  Contact a health care provider if: You have redness, swelling, or pain around your incision. You have a fever.  

## 2022-01-15 NOTE — Progress Notes (Signed)
Electrophysiology Office Note   Date:  01/15/2022   ID:  Steven Oliver, DOB 07/12/1950, MRN [id number]  PCP:  de [place], Raymond J, MD  Cardiologist:  Harrell Gave Primary Electrophysiologist:  Steven Chesnut Meredith Leeds, MD    Chief Complaint: AF   History of Present Illness: Steven Oliver is a 72 y.o. male who is being seen today for the evaluation of AF at the request of [place], Blondell Reveal, MD. Presenting today for electrophysiology evaluation.  History significant for CVA, TIA, atrial fibrillation, hyperlipidemia.  He moved from [facility] to be closer to family.  He had a stroke 02/20/2018 when he traveled to [place].  He was found to have a PFO.  He had a Linq monitor implanted at the time.  He had 1 episode of atrial fibrillation was started on Eliquis.  He has had some memory issues since his stroke.  Today, denies symptoms of palpitations, chest pain, shortness of breath, orthopnea, PND, lower extremity edema, claudication, dizziness, presyncope, syncope, bleeding, or neurologic sequela. The patient is tolerating medications without difficulties.      Past Medical History:  Diagnosis Date   CVA, old, cognitive deficits    Hyperlipidemia    Hypertension    History reviewed. No pertinent surgical history.   Current Outpatient Medications  Medication Sig Dispense Refill   apixaban (ELIQUIS) 5 MG TABS tablet Take 1 tablet (5 mg total) by mouth 2 (two) times daily. 180 tablet 1   cholecalciferol (VITAMIN D) 25 MCG (1000 UNIT) tablet Take by mouth.     divalproex (DEPAKOTE) 125 MG DR tablet Take 1 tablet (125 mg total) by mouth 2 (two) times daily. 180 tablet 1   memantine (NAMENDA) 10 MG tablet TAKE 1 TABLET BY MOUTH TWICE A DAY 180 tablet 0   Multiple Vitamins-Minerals (CENTRUM SILVER PO) Take by mouth.     QUEtiapine (SEROQUEL) 25 MG tablet Take 1 tablet (25 mg total) by mouth 2 (two) times daily. 180 tablet 1   rosuvastatin (CRESTOR) 20 MG tablet Take 1 tablet (20 mg total) by  mouth daily. 90 tablet 3   XIIDRA 5 % SOLN Apply 1 drop to eye 2 (two) times daily.     No current facility-administered medications for this visit.    Allergies:   Patient has no known allergies.   Social History:  The patient  reports that he quit smoking about 10 months ago. His smoking use included cigarettes. He has never been exposed to tobacco smoke. He has never used smokeless tobacco. He reports current alcohol use of about 1.0 standard drink of alcohol per week. He reports that he does not use drugs.   Family History:  The patient's family history includes Alzheimer's disease in his brother, maternal aunt, and mother.   ROS:  Please see the history of present illness.   Otherwise, review of systems is positive for none.   All other systems are reviewed and negative.   PHYSICAL EXAM: VS:  BP (!) 146/84   Pulse (!) 104   Ht 6' (1.829 m)   Wt 187 lb (84.8 kg)   SpO2 96%   BMI 25.36 kg/m  , BMI Body mass index is 25.36 kg/m. GEN: Well nourished, well developed, in no acute distress  HEENT: normal  Neck: no JVD, carotid bruits, or masses Cardiac: RRR; no murmurs, rubs, or gallops,no edema  Respiratory:  clear to auscultation bilaterally, normal work of breathing GI: soft, nontender, nondistended, + BS MS: no deformity or atrophy  Skin: warm and dry, device site well healed Neuro:  Strength and sensation are intact Psych: euthymic mood, full affect  EKG:  EKG is ordered today. Personal review of the ekg ordered shows sinus rhythm  Personal review of the device interrogation today. Results in [place]: 11/28/2021: ALT 18; BUN 20; Creatinine, Ser 1.03; Hemoglobin 14.7; Platelets 231; Potassium 4.2; Sodium 144; TSH 1.660    Lipid Panel     Component Value Date/Time   CHOL 131 11/28/2021 1102   TRIG 60 11/28/2021 1102   HDL 54 11/28/2021 1102   CHOLHDL 2.4 11/28/2021 1102   LDLCALC 64 11/28/2021 1102     Wt Readings from Last 3 Encounters:   01/15/22 187 lb (84.8 kg)  11/15/21 183 lb (83 kg)  05/28/21 177 lb (80.3 kg)      Other studies Reviewed: Additional studies/ records that were reviewed today include: epic notes  ASSESSMENT AND PLAN:  1.  Paroxysmal atrial fibrillation: CHA2DS2-VASc of at least 3.  Currently on Eliquis 5 mg twice daily.  Had a cryptogenic stroke and thus Steven Oliver need lifelong anticoagulation.  ILR implanted for atrial fibrillation and stroke monitoring.  At this point, he would like his ILR to be explanted.  Risk and benefits were discussed.  Risk include bleeding and infection.  He understands these risks and is agreed to the procedure.  2.  Hyperlipidemia: Continue atorvastatin 80 mg per primary cardiology  3.  Secondary hypercoagulable state: Currently on Eliquis for atrial fibrillation as above   Current medicines are reviewed at length with the patient today.   The patient does not have concerns regarding his medicines.  The following changes were made today: None  Labs/ tests ordered today include:  No orders of the defined types were placed in this encounter.    Disposition:   FU 1 year  Signed, Steven Sulton Meredith Leeds, MD  01/15/2022 1:02 PM     [place] [address] [phone] (office) [phone] (fax)  SURGEON:  Allegra Lai, MD     PREPROCEDURE DIAGNOSIS:  CVA    POSTPROCEDURE DIAGNOSIS:  CVA     PROCEDURES:   1. Implantable loop recorder implantation    INTRODUCTION:  Steven Oliver is a 72 y.o. male with a history of unexplained stroke who presents today for implantable loop explant.  he has had a LINQ monitor.  LINQ is now at [facility] and wishes the monitor explanted.     DESCRIPTION OF PROCEDURE:  Informed written consent was obtained, and the patient was brought to the electrophysiology lab in a fasting state.  The patient required no sedation for the procedure today.  The patients left chest was therefore prepped and  draped in the usual sterile fashion by the EP lab staff. The skin overlying the left parasternal region was infiltrated with lidocaine for local analgesia.  A 0.5-cm incision was made over the left parasternal region over the existing LINQ monitor.  Using a combination of sharp and blount dissection, the LINQ monitor was removed from the pocket.  Steri- Strips and a sterile dressing were then applied.  There were no early apparent complications.     CONCLUSIONS:   1. Successful explant of a [employer] Reveal LINQ implantable loop recorder for CVA  2. No early apparent complications.

## 2022-01-16 NOTE — Addendum Note (Signed)
Addended by: Janan Halter F on: 01/16/2022 07:15 PM   Modules accepted: Orders

## 2022-02-14 ENCOUNTER — Encounter (HOSPITAL_BASED_OUTPATIENT_CLINIC_OR_DEPARTMENT_OTHER): Payer: Self-pay

## 2022-02-26 ENCOUNTER — Other Ambulatory Visit: Payer: Self-pay | Admitting: Physician Assistant

## 2022-02-28 ENCOUNTER — Ambulatory Visit (HOSPITAL_BASED_OUTPATIENT_CLINIC_OR_DEPARTMENT_OTHER): Payer: Medicare Other | Admitting: Family Medicine

## 2022-03-10 ENCOUNTER — Other Ambulatory Visit (HOSPITAL_BASED_OUTPATIENT_CLINIC_OR_DEPARTMENT_OTHER): Payer: Self-pay | Admitting: Cardiology

## 2022-03-10 DIAGNOSIS — Z8673 Personal history of transient ischemic attack (TIA), and cerebral infarction without residual deficits: Secondary | ICD-10-CM

## 2022-04-03 ENCOUNTER — Encounter (HOSPITAL_BASED_OUTPATIENT_CLINIC_OR_DEPARTMENT_OTHER): Payer: Self-pay | Admitting: Family Medicine

## 2022-04-03 ENCOUNTER — Ambulatory Visit (INDEPENDENT_AMBULATORY_CARE_PROVIDER_SITE_OTHER): Payer: Medicare Other | Admitting: Family Medicine

## 2022-04-03 VITALS — BP 137/88 | HR 88 | Ht 72.0 in | Wt 192.3 lb

## 2022-04-03 DIAGNOSIS — M545 Low back pain, unspecified: Secondary | ICD-10-CM | POA: Diagnosis not present

## 2022-04-03 DIAGNOSIS — F03911 Unspecified dementia, unspecified severity, with agitation: Secondary | ICD-10-CM | POA: Diagnosis not present

## 2022-04-03 DIAGNOSIS — I480 Paroxysmal atrial fibrillation: Secondary | ICD-10-CM | POA: Diagnosis not present

## 2022-04-03 DIAGNOSIS — R29898 Other symptoms and signs involving the musculoskeletal system: Secondary | ICD-10-CM | POA: Insufficient documentation

## 2022-04-03 DIAGNOSIS — E785 Hyperlipidemia, unspecified: Secondary | ICD-10-CM

## 2022-04-03 DIAGNOSIS — E860 Dehydration: Secondary | ICD-10-CM

## 2022-04-03 DIAGNOSIS — F01C0 Vascular dementia, severe, without behavioral disturbance, psychotic disturbance, mood disturbance, and anxiety: Secondary | ICD-10-CM

## 2022-04-03 DIAGNOSIS — G8929 Other chronic pain: Secondary | ICD-10-CM

## 2022-04-03 DIAGNOSIS — Z6826 Body mass index (BMI) 26.0-26.9, adult: Secondary | ICD-10-CM | POA: Insufficient documentation

## 2022-04-03 MED ORDER — QUETIAPINE FUMARATE 50 MG PO TABS: 25.0000 mg | ORAL_TABLET | Freq: Two times a day (BID) | ORAL | 0 refills | Status: DC

## 2022-04-03 NOTE — Progress Notes (Signed)
Established Patient Office Visit Follow-Up for Chronic Conditions   Subjective   Patient ID: Steven Oliver, male    DOB: 1950-06-16  Age: 72 y.o. MRN: [id number]  72 yo male patient presents today with his wife for follow-up regarding his chronic medical conditions.  His wife Seth Bake is providing the history today.   Hyperlipidemia- patient is currently on 20mg  Crestor  pAifb- eliquis  Hx of CVA/TIA, unsure of reason for strokes  Was followed by cards but wife reports that he no longer needs to be followed by cards since he had his Loop recorder removed  Denies symptoms of palpitations, chest pain, shortness of breath, orthopnea, lower  extremity edema, dizziness, syncope or bleeding.   Dementia- followed by neurology, see Q6 months- due in May  Diagnosed a year ago officially with Alzheimer's  Wife brought in a paper (which will be scanned into the chart) with new neurocognitive symptoms he is having.  She reports that his memory continues to decline.  She reports that he has more issues with sitting down.  She thinks this is in relation to spatial awareness.  He also has a difficult time understanding where things are.  He has issues with standing from sitting position.  Sometimes he needs to use the walker to get off of it.  Wife reports that he is having issues using his utensils, needing assistance with his ADLs, and is having difficulty searching for his words more often.  She is concerned that he is not drinking enough fluids.  She reports that she is lucky to get 1 to 2 cups of coffee and 16 ounces of water in him each day.  She also mentions having issues with incontinence more frequently.  She has a caregiver that comes to help her every Tuesday for 4 hours.  She reports that her biggest concern is that he is getting more agitated.  Last week he became agitated 3 times out of nowhere. Starts to rant and rave--"I want to be out of here. If I had a rope I would hang myself. If I had a gun,  I would kill myself." Wife reports he does not have access to these.  The wife reports that he has occasional visual hallucinations. Denies any issues with sleeping. Reports that he "can sleep anywhere at anytime." She thinks he is more frustrated with his memory and this is being expressed in anger.   Lower back pain- Lots of back pain when sitting or standing but not when she palpates his back  Denies recent falls    HTN- BP is better controlled today, compared to past readings. No need for medication management at this time.  Wife reports she checks his BP at home and it is lower than this office reading.   Review of Systems  Constitutional:  Negative for chills, fever and weight loss.  Respiratory:  Negative for cough and shortness of breath.   Cardiovascular:  Negative for chest pain.  Gastrointestinal:  Negative for abdominal pain, nausea and vomiting.  Genitourinary:  Positive for frequency.       Occasional incontinence  Musculoskeletal:  Negative for myalgias.  Neurological:  Negative for weakness and headaches.  Psychiatric/Behavioral:  Positive for depression. Negative for suicidal ideas. The patient does not have insomnia.      Objective:     BP 137/88   Pulse 88   Ht 6' (1.829 m)   Wt 192 lb 4.8 oz (87.2 kg)   SpO2 99%   BMI  26.08 kg/m  BP Readings from Last 3 Encounters:  04/03/22 137/88  01/15/22 (!) 146/84  11/28/21 (!) 145/100     Physical Exam Constitutional:      Appearance: Normal appearance.  Cardiovascular:     Rate and Rhythm: Normal rate and regular rhythm.     Heart sounds: Normal heart sounds.  Pulmonary:     Effort: Pulmonary effort is normal.     Breath sounds: Normal breath sounds.  Neurological:     Mental Status: He is alert. Mental status is at baseline.      Assessment & Plan:  1. 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  She reports when he cannot see  her, he becomes distressed. Her biggest concern is that he has started to become agitated with no precipitating factors more frequently. She reports this occurred three times this past week. She reports that he starts to scream and also makes comments about wanting to hurt himself during these moments.  She has no concerns about his or her safety. He has no access to weapons in the home and is always supervised either by her or their caregiver.  Will continue with 25mg  Seroquel in the morning and plan to increase evening dose to 50mg  Seroquel.  If we feel we need better agitation control, can increase to 50 mg twice daily. Discussed adding SSRI to help with depressive symptoms if needed.   - QUEtiapine (SEROQUEL) 50 MG tablet; Take 0.5 tablets (25 mg total) by mouth 2 (two) times daily.  Dispense: 180 tablet; Refill: 0  3. PAF (paroxysmal atrial fibrillation) (HCC) Heart rate regular and normal during exam today. No acute symptoms present during exam. Continue taking apixaban 5mg  BID.   4. 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 Continue taking depakote 125mg  BID, Namenda 10mg  BID. Will increase evening dose of Seroquel to 50mg  (see above).   5. Chronic low back pain, unspecified back pain laterality, unspecified whether sciatica present Wife reports that patient has lumbar pain with sitting and standing.  This may be due to decreased movement or chronic degenerative disease.  Wife and patient agreeable to home physical therapy to help with mobility and pain.  6. Dyslipidemia Patient currently taking Crestor 20 mg and tolerating this medication well.  His lipid panel is within normal range.  Will check again today. - Lipid panel  7. Dehydration Wife is concerned that patient is not drinking enough fluids.  She had bought a device from [place] to remind him to take  drinks every 30 minutes from his water bottle.  Will order CBC and CMP today to ensure adequate fluid intake. - CBC with Differential/Platelet - COMPLETE METABOLIC PANEL WITH GFR  9. Muscular deconditioning Wife reports a decline in ability to stand from a sitting position.  She reports that he has to use a walker in order to stand sometimes.  She is also concerned about his spatial awareness and safety when he goes to sit down.  Recommended home physical therapy to prevent decline in muscular atrophy.  10. Body mass index (BMI) 26.0-26.9, adult Will check hemoglobin A1c.  Spent 40 minutes on this patient encounter, including preparation, chart review of specialists notes, medication review, face-to-face counseling with patient and wife regarding coordination of care, and documentation of encounter.    Return in about 6 months (around 10/04/2022)  for chronic conditions .     Les Pou, FNP

## 2022-04-04 ENCOUNTER — Encounter (HOSPITAL_BASED_OUTPATIENT_CLINIC_OR_DEPARTMENT_OTHER): Payer: Self-pay | Admitting: Family Medicine

## 2022-04-04 LAB — CBC WITH DIFFERENTIAL/PLATELET
Basophils Absolute: 0.1 10*3/uL (ref 0.0–0.2)
Basos: 1 %
EOS (ABSOLUTE): 0.2 10*3/uL (ref 0.0–0.4)
Eos: 3 %
Hematocrit: 43 % (ref 37.5–51.0)
Hemoglobin: 14.2 g/dL (ref 13.0–17.7)
Immature Grans (Abs): 0 10*3/uL (ref 0.0–0.1)
Immature Granulocytes: 0 %
Lymphocytes Absolute: 1.7 10*3/uL (ref 0.7–3.1)
Lymphs: 19 %
MCH: 30 pg (ref 26.6–33.0)
MCHC: 33 g/dL (ref 31.5–35.7)
MCV: 91 fL (ref 79–97)
Monocytes Absolute: 0.7 10*3/uL (ref 0.1–0.9)
Monocytes: 7 %
Neutrophils Absolute: 6.5 10*3/uL (ref 1.4–7.0)
Neutrophils: 70 %
Platelets: 221 10*3/uL (ref 150–450)
RBC: 4.73 x10E6/uL (ref 4.14–5.80)
RDW: 13.1 % (ref 11.6–15.4)
WBC: 9.2 10*3/uL (ref 3.4–10.8)

## 2022-04-04 LAB — COMPREHENSIVE METABOLIC PANEL
ALT: 25 IU/L (ref 0–44)
AST: 23 IU/L (ref 0–40)
Albumin/Globulin Ratio: 1.9 (ref 1.2–2.2)
Albumin: 4.5 g/dL (ref 3.8–4.8)
Alkaline Phosphatase: 88 IU/L (ref 44–121)
BUN/Creatinine Ratio: 15 (ref 10–24)
BUN: 18 mg/dL (ref 8–27)
Bilirubin Total: 0.3 mg/dL (ref 0.0–1.2)
CO2: 23 mmol/L (ref 20–29)
Calcium: 10 mg/dL (ref 8.6–10.2)
Chloride: 102 mmol/L (ref 96–106)
Creatinine, Ser: 1.21 mg/dL (ref 0.76–1.27)
Globulin, Total: 2.4 g/dL (ref 1.5–4.5)
Glucose: 87 mg/dL (ref 70–99)
Potassium: 4.9 mmol/L (ref 3.5–5.2)
Sodium: 142 mmol/L (ref 134–144)
Total Protein: 6.9 g/dL (ref 6.0–8.5)
eGFR: 64 mL/min/{1.73_m2} (ref >59)

## 2022-04-04 LAB — TSH RFX ON ABNORMAL TO FREE T4: TSH: 3.12 u[IU]/mL (ref 0.450–4.500)

## 2022-04-04 LAB — LIPID PANEL
Chol/HDL Ratio: 2.5 ratio (ref 0.0–5.0)
Cholesterol, Total: 134 mg/dL (ref 100–199)
HDL: 53 mg/dL (ref >39)
LDL Chol Calc (NIH): 64 mg/dL (ref 0–99)
Triglycerides: 88 mg/dL (ref 0–149)
VLDL Cholesterol Cal: 17 mg/dL (ref 5–40)

## 2022-04-04 LAB — HEMOGLOBIN A1C
Est. average glucose Bld gHb Est-mCnc: 128 mg/dL
Hgb A1c MFr Bld: 6.1 % — ABNORMAL HIGH (ref 4.8–5.6)

## 2022-04-07 NOTE — Telephone Encounter (Signed)
Patient viewed labs via [place]

## 2022-04-25 ENCOUNTER — Ambulatory Visit (INDEPENDENT_AMBULATORY_CARE_PROVIDER_SITE_OTHER): Payer: Medicare Other | Admitting: Cardiology

## 2022-04-25 ENCOUNTER — Encounter (HOSPITAL_BASED_OUTPATIENT_CLINIC_OR_DEPARTMENT_OTHER): Payer: Self-pay | Admitting: Cardiology

## 2022-04-25 VITALS — BP 122/78 | HR 98 | Ht 72.0 in | Wt 192.8 lb

## 2022-04-25 DIAGNOSIS — D6869 Other thrombophilia: Secondary | ICD-10-CM

## 2022-04-25 DIAGNOSIS — Z8673 Personal history of transient ischemic attack (TIA), and cerebral infarction without residual deficits: Secondary | ICD-10-CM | POA: Diagnosis not present

## 2022-04-25 DIAGNOSIS — I480 Paroxysmal atrial fibrillation: Secondary | ICD-10-CM | POA: Diagnosis not present

## 2022-04-25 DIAGNOSIS — Z7901 Long term (current) use of anticoagulants: Secondary | ICD-10-CM

## 2022-04-25 DIAGNOSIS — Q2112 Patent foramen ovale: Secondary | ICD-10-CM | POA: Diagnosis not present

## 2022-04-25 DIAGNOSIS — E7800 Pure hypercholesterolemia, unspecified: Secondary | ICD-10-CM

## 2022-04-25 NOTE — Patient Instructions (Signed)
Medication Instructions:  The current medical regimen is effective;  continue present plan and medications.  *If you need a refill on your cardiac medications before your next appointment, please call your pharmacy*   Lab Work: N/A   Testing/Procedures: N/A   Follow-Up: At [facility], you and your health needs are our priority.  As part of our continuing mission to provide you with exceptional heart care, we have created designated Provider Care Teams.  These Care Teams include your primary Cardiologist (physician) and Advanced Practice Providers (APPs -  Physician Assistants and Nurse Practitioners) who all work together to provide you with the care you need, when you need it.  We recommend signing up for the patient portal called "MyChart".  Sign up information is provided on this After Visit Summary.  MyChart is used to connect with patients for Virtual Visits (Telemedicine).  Patients are able to view lab/test results, encounter notes, upcoming appointments, etc.  Non-urgent messages can be sent to your provider as well.   To learn more about what you can do with MyChart, go to [email or web address].    Your next appointment:   1 year(s)  Provider:   Bridgette Christopher, MD    Other Instructions N/A  
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
## 2022-05-15 ENCOUNTER — Other Ambulatory Visit (HOSPITAL_BASED_OUTPATIENT_CLINIC_OR_DEPARTMENT_OTHER): Payer: Self-pay | Admitting: Nurse Practitioner

## 2022-05-15 DIAGNOSIS — R413 Other amnesia: Secondary | ICD-10-CM

## 2022-05-15 DIAGNOSIS — F05 Delirium due to known physiological condition: Secondary | ICD-10-CM

## 2022-05-20 ENCOUNTER — Other Ambulatory Visit (HOSPITAL_BASED_OUTPATIENT_CLINIC_OR_DEPARTMENT_OTHER): Payer: Self-pay | Admitting: Family Medicine

## 2022-05-20 ENCOUNTER — Telehealth (HOSPITAL_BASED_OUTPATIENT_CLINIC_OR_DEPARTMENT_OTHER): Payer: Self-pay

## 2022-05-20 DIAGNOSIS — Z79899 Other long term (current) drug therapy: Secondary | ICD-10-CM

## 2022-05-20 NOTE — Telephone Encounter (Signed)
Called and spoke with pt's wife in regards to dementia medication (Depakote). She stated that he is in hospice care at [facility] but provider Elwyn Lade, FNP believe it is Palliative Care. Provider also wanted schedule a follow-up appointment after he sees neurology. Wife stated she would call back.

## 2022-06-04 ENCOUNTER — Ambulatory Visit: Payer: Medicare Other | Admitting: Physician Assistant

## 2022-06-06 ENCOUNTER — Ambulatory Visit: Payer: Medicare Other | Admitting: Physician Assistant

## 2022-06-06 ENCOUNTER — Encounter: Payer: Self-pay | Admitting: Physician Assistant

## 2022-06-06 DIAGNOSIS — Z029 Encounter for administrative examinations, unspecified: Secondary | ICD-10-CM

## 2022-06-24 ENCOUNTER — Ambulatory Visit (INDEPENDENT_AMBULATORY_CARE_PROVIDER_SITE_OTHER)

## 2022-06-24 ENCOUNTER — Encounter (HOSPITAL_BASED_OUTPATIENT_CLINIC_OR_DEPARTMENT_OTHER): Payer: Self-pay

## 2022-06-24 VITALS — Ht 71.0 in | Wt 192.0 lb

## 2022-06-24 DIAGNOSIS — Z0000 Encounter for general adult medical examination without abnormal findings: Secondary | ICD-10-CM

## 2022-06-24 NOTE — Progress Notes (Signed)
 I connected with  Steven Oliver on 06/24/22 by a audio enabled telemedicine application and verified that I am speaking with the correct person using two identifiers.  Patient Location: Home  Provider Location: Home Office  I discussed the limitations of evaluation and management by telemedicine. The patient expressed understanding and agreed to proceed.  VISIT COMPLETED WITH THE ASSISTANCE OF PATIENT'S SPOUSE [name]. HE IS UNABLE TO ANSWER THE CIT QUESTIONS DUE TO ADVANCED ALZHEIMERS DISEASE  MEDICATIONS MARKED AS NOT TAKING WERE D/C'ED BY PALLIATIVE CARE TEAM Subjective:   Steven Oliver is a 72 y.o. male who presents for Medicare Annual/Subsequent preventive examination.  Review of Systems     Cardiac Risk Factors include: advanced age (>61men, >57 women);dyslipidemia;male gender;obesity (BMI >30kg/m2);sedentary lifestyle     Objective:    Today's Vitals   06/24/22 1517  Weight: 192 lb (87.1 kg)  Height: 5\' 11"  (1.803 m)   Body mass index is 26.78 kg/m.     06/24/2022    3:27 PM 05/10/2021   10:56 AM 03/12/2021    1:24 PM  Advanced Directives  Does Patient Have a Medical Advance Directive? Yes Yes Yes  Type of Estate agent of [place];Living will Healthcare Power of [place];Living will;Out of facility DNR (pink MOST or yellow form)   Does patient want to make changes to medical advance directive? No - Patient declined    Copy of Healthcare Power of Attorney in Chart? Yes - validated most recent copy scanned in chart (See row information)      Current Medications (verified) Outpatient Encounter Medications as of 06/24/2022  Medication Sig   apixaban (ELIQUIS) 5 MG TABS tablet Take 1 tablet (5 mg total) by mouth 2 (two) times daily.   cholecalciferol (VITAMIN D) 25 MCG (1000 UNIT) tablet Take by mouth.   divalproex (DEPAKOTE) 125 MG DR tablet Take 1 tablet (125 mg total) by mouth 2 (two) times daily.   Multiple Vitamins-Minerals (CENTRUM SILVER PO)  Take by mouth.   QUEtiapine (SEROQUEL) 50 MG tablet Take 1/2 tablet(25 mg) by mouth in the morning and 1 tablet (50 mg) by mouth at bedtime   memantine (NAMENDA) 10 MG tablet TAKE 1 TABLET BY MOUTH TWICE A DAY (Patient not taking: Reported on 06/24/2022)   rosuvastatin (CRESTOR) 20 MG tablet TAKE 1 TABLET BY MOUTH EVERY DAY (Patient not taking: Reported on 06/24/2022)   No facility-administered encounter medications on file as of 06/24/2022.    Allergies (verified) Patient has no known allergies.   History: Past Medical History:  Diagnosis Date   CVA, old, cognitive deficits    H/O: stroke 02/28/2021   Hyperlipidemia    Hypertension    PAF (paroxysmal atrial fibrillation) (HCC) 08/23/2020   PFO (patent foramen ovale) 02/28/2021   History reviewed. No pertinent surgical history. Family History  Problem Relation Age of Onset   Alzheimer's disease Mother    Alzheimer's disease Brother    Alzheimer's disease Maternal Aunt    Social History   Socioeconomic History   Marital status: Married    Spouse name: Steven Oliver   Number of children: 2   Years of education: 12   Highest education level: Not on file  Occupational History   Not on file  Tobacco Use   Smoking status: Former    Types: Cigarettes    Quit date: 03/13/2021    Years since quitting: 1.2    Passive exposure: Never   Smokeless tobacco: Never  Vaping Use   Vaping Use: Never used  Substance and Sexual Activity   Alcohol use: Yes    Alcohol/week: 1.0 standard drink of alcohol    Types: 1 Glasses of wine per week   Drug use: Never   Sexual activity: Not Currently    Partners: Female  Other Topics Concern   Not on file  Social History Narrative   Right handed   Drinks caffeine   Two story home   Retired lives with wife      06/24/2022-----now has advanced Alzheimer's Disease. In palliative care. Wife assisted with visit.   Social Determinants of Health   Financial Resource Strain: Low Risk  (06/24/2022)    Overall Financial Resource Strain (CARDIA)    Difficulty of Paying Living Expenses: Not hard at all  Food Insecurity: No Food Insecurity (06/24/2022)   Hunger Vital Sign    Worried About Running Out of Food in the Last Year: Never true    Ran Out of Food in the Last Year: Never true  Transportation Needs: No Transportation Needs (06/24/2022)   PRAPARE - Administrator, Civil Service (Medical): No    Lack of Transportation (Non-Medical): No  Physical Activity: Patient Unable To Answer (06/24/2022)   Exercise Vital Sign    Days of Exercise per Week: Patient unable to answer    Minutes of Exercise per Session: Patient unable to answer  Stress: Patient Unable To Answer (06/24/2022)   [employer] of Occupational Health - Occupational Stress Questionnaire    Feeling of Stress : Patient unable to answer  Social Connections: Patient Unable To Answer (06/24/2022)   Social Connection and Isolation Panel [NHANES]    Frequency of Communication with Friends and Family: Patient unable to answer    Frequency of Social Gatherings with Friends and Family: Patient unable to answer    Attends Religious Services: Patient unable to answer    Active Member of Clubs or Organizations: Patient unable to answer    Attends Banker Meetings: Patient unable to answer    Marital Status: Patient unable to answer    Tobacco Counseling Counseling given: Not Answered   Clinical Intake:  Pre-visit preparation completed: Yes  Pain : No/denies pain     BMI - recorded: 26.78 Nutritional Status: BMI 25 -29 Overweight Nutritional Risks: None Diabetes: No  How often do you need to have someone help you when you read instructions, pamphlets, or other written materials from your doctor or pharmacy?: 5 - Always (Alzheimers disease)  Diabetic?no  Interpreter Needed?: No  Information entered by ::  Jennfier Abdulla, CMA   Activities of Daily Living    06/24/2022    3:24 PM  In your  present state of health, do you have any difficulty performing the following activities:  Hearing? 0  Vision? 0  Difficulty concentrating or making decisions? 1  Walking or climbing stairs? 1  Dressing or bathing? 1  Doing errands, shopping? 1  Preparing Food and eating ? Y  Using the Toilet? Y  In the past six months, have you accidently leaked urine? Y  Do you have problems with loss of bowel control? Y  Managing your Medications? Y  Managing your Finances? Y  Housekeeping or managing your Housekeeping? Y    Patient Care Team: Alyson Reedy, FNP as PCP - General (Family Medicine) Jodelle Red, MD as PCP - Cardiology (Cardiology) Elwyn Reach (Neurology)  Indicate any recent Medical Services you may have received from other than [facility] providers in the past year (date may be  approximate).     Assessment:   This is a routine wellness examination for Steven Oliver.  Hearing/Vision screen Hearing Screening - Comments:: Spouse states patient does not have any hearing difficulties Vision Screening - Comments:: Patient does not wear eyeglasses   Dietary issues and exercise activities discussed: Current Exercise Habits: The patient does not participate in regular exercise at present, Exercise limited by: neurologic condition(s);psychological condition(s);Other - see comments   Goals Addressed             This Visit's Progress    Patient Stated       Patient unable to set a goal due to advanced Alzheimer's Disease.        Depression Screen    06/24/2022    3:22 PM 04/03/2022    1:46 PM 11/28/2021    9:52 AM 06/21/2021   11:41 AM 05/28/2021    9:39 AM 03/01/2021    4:21 PM  PHQ 2/9 Scores  PHQ - 2 Score  6 4 0 0 2  PHQ- 9 Score  17 16   7   Exception Documentation Other- indicate reason in comment box Other- indicate reason in comment box   Medical reason   Not completed patient has Alzheimers Disease. Unable to answer this section Patients wife states he is  always depressed due to his alzheimers diagnosis        Fall Risk    06/24/2022    3:20 PM 04/03/2022    1:48 PM 06/21/2021   11:45 AM 05/28/2021    9:38 AM 05/10/2021   10:55 AM  Fall Risk   Falls in the past year? 0 0 0 0 0  Number falls in past yr: 0 0 0 0 0  Injury with Fall? 0 0 0 0 0  Risk for fall due to : Impaired balance/gait;Impaired mobility;Mental status change;Other (Comment)  No Fall Risks No Fall Risks   Risk for fall due to: Comment Alzheimers disease      Follow up Education provided;Falls prevention discussed  Falls evaluation completed Falls evaluation completed;Education provided     FALL RISK PREVENTION PERTAINING TO THE HOME:  Any stairs in or around the home? Yes  If so, are there any without handrails? No  Home free of loose throw rugs in walkways, pet beds, electrical cords, etc? Yes  Adequate lighting in your home to reduce risk of falls? Yes   ASSISTIVE DEVICES UTILIZED TO PREVENT FALLS:  Life alert? No  Use of a cane, walker or w/c? Yes  Grab bars in the bathroom? Yes  Shower chair or bench in shower? Yes  Elevated toilet seat or a handicapped toilet? Yes   TIMED UP AND GO:  Was the test performed? No .   Cognitive Function:      03/13/2021    9:00 PM  Montreal Cognitive Assessment   Visuospatial/ Executive (0/5) 0  Naming (0/3) 1  Attention: Read list of digits (0/2) 2  Attention: Read list of letters (0/1) 0  Attention: Serial 7 subtraction starting at 100 (0/3) 0  Language: Repeat phrase (0/2) 0  Language : Fluency (0/1) 0  Abstraction (0/2) 0  Delayed Recall (0/5) 0  Orientation (0/6) 1  Total 4  Adjusted Score (based on education) 4      06/21/2021   11:46 AM  6CIT Screen  What Year? 4 points  What month? 3 points  Count back from 20 0 points  Months in reverse 2 points  Repeat phrase 6 points  Immunizations Immunization History  Administered Date(s) Administered   Fluad Quad(high Dose 65+) 10/03/2021    Influenza-Unspecified 09/13/2020    TDAP status: Due, Education has been provided regarding the importance of this vaccine. Advised may receive this vaccine at local pharmacy or Health Dept. Aware to provide a copy of the vaccination record if obtained from local pharmacy or Health Dept. Verbalized acceptance and understanding.  Flu Vaccine status: Up to date  Pneumococcal vaccine status: Declined,  Education has been provided regarding the importance of this vaccine but patient still declined. Advised may receive this vaccine at local pharmacy or Health Dept. Aware to provide a copy of the vaccination record if obtained from local pharmacy or Health Dept. Verbalized acceptance and understanding.   Covid-19 vaccine status: Information provided on how to obtain vaccines.   Qualifies for Shingles Vaccine? Yes   Zostavax completed No   Shingrix Completed?: No.    Education has been provided regarding the importance of this vaccine. Patient has been advised to call insurance company to determine out of pocket expense if they have not yet received this vaccine. Advised may also receive vaccine at local pharmacy or Health Dept. Verbalized acceptance and understanding.  Screening Tests Health Maintenance  Topic Date Due   COVID-19 Vaccine (1) Never done   Pneumonia Vaccine 25+ Years old (1 of 2 - PCV) Never done   Hepatitis C Screening  Never done   DTaP/Tdap/Td (1 - Tdap) Never done   Zoster Vaccines- Shingrix (1 of 2) Never done   INFLUENZA VACCINE  08/14/2022   Colonoscopy  10/14/2027   HPV VACCINES  Aged Out    Health Maintenance  Health Maintenance Due  Topic Date Due   COVID-19 Vaccine (1) Never done   Pneumonia Vaccine 49+ Years old (1 of 2 - PCV) Never done   Hepatitis C Screening  Never done   DTaP/Tdap/Td (1 - Tdap) Never done   Zoster Vaccines- Shingrix (1 of 2) Never done    Colorectal cancer screening: Type of screening: Colonoscopy. Completed 10/13/2017. Repeat every 10  years  Lung Cancer Screening: (Low Dose CT Chest recommended if Age 49-80 years, 30 pack-year currently smoking OR have quit w/in 15years.) does not qualify.    Additional Screening:  Hepatitis C Screening: does qualify; not ordered. Patient is now in palliative care due to advanced alzheimers disease  Vision Screening: Recommended annual ophthalmology exams for early detection of glaucoma and other disorders of the eye. Is the patient up to date with their annual eye exam?  Yes  Who is the provider or what is the name of the office in which the patient attends annual eye exams?    Dental Screening: Recommended annual dental exams for proper oral hygiene  Community Resource Referral / Chronic Care Management: CRR required this visit?  No   CCM required this visit?  No      Plan:     I have personally reviewed and noted the following in the patient's chart:   Medical and social history Use of alcohol, tobacco or illicit drugs  Current medications and supplements including opioid prescriptions. Patient is not currently taking opioid prescriptions. Functional ability and status Nutritional status Physical activity Advanced directives List of other physicians Hospitalizations, surgeries, and ER visits in previous 12 months Vitals Screenings to include cognitive, depression, and falls Referrals and appointments  In addition, I have reviewed and discussed with patient certain preventive protocols, quality metrics, and best practice recommendations. A written personalized care plan  for preventive services as well as general preventive health recommendations were provided to patient.   Due to this being a telephonic visit, the after visit summary with patients personalized plan was offered to patient via mail or my-chart. Patient would like to access their AVS via my-chart    Jordan Hawks [employer], CMA   06/24/2022   Nurse Notes: PATIENT IS NOW IN PALLIATIVE CARE DUE TO ADVANCED  ALZHEIMERS DISEASE. VISIT COMPLETED WITH THE HELP OF HIS SPOUSE

## 2022-06-24 NOTE — Patient Instructions (Signed)
Steven Oliver , Thank you for taking time to come for your Medicare Wellness Visit. I appreciate your ongoing commitment to your health goals. Please review the following plan we discussed and let me know if I can assist you in the future.   These are the goals we discussed:  Goals      Patient Stated     Patient unable to set a goal due to advanced Alzheimer's Disease.      Quit Smoking        This is a list of the screening recommended for you and due dates:  Health Maintenance  Topic Date Due   COVID-19 Vaccine (1) Never done   Pneumonia Vaccine (1 of 2 - PCV) Never done   Hepatitis C Screening  Never done   DTaP/Tdap/Td vaccine (1 - Tdap) Never done   Zoster (Shingles) Vaccine (1 of 2) Never done   Flu Shot  08/14/2022   Colon Cancer Screening  10/14/2027   HPV Vaccine  Aged Out    Advanced directives: Advance directive discussed with you today. Even though you declined this today, please call our office should you change your mind, and we can give you the proper paperwork for you to fill out. Advance care planning is a way to make decisions about medical care that fits your values in case you are ever unable to make these decisions for yourself.  Information on Advanced Care Planning can be found at [facility] [place] Advance Health Care Directives Advance Health Care Directives ([email or web address]    Conditions/risks identified: ADVANCED ALZHEIMERS DISEASE  Next appointment: Follow up in one year for your annual wellness visit.  June 30 2023 AT 3:30 TELEPHONE VISIT  Preventive Care 4 Years and Older, Male  Preventive care refers to lifestyle choices and visits with your health care provider that can promote health and wellness. What does preventive care include? A yearly physical exam. This is also called an annual well check. Dental exams once or twice a year. Routine eye exams. Ask your health care provider how often you should have your eyes  checked. Personal lifestyle choices, including: Daily care of your teeth and gums. Regular physical activity. Eating a healthy diet. Avoiding tobacco and drug use. Limiting alcohol use. Practicing safe sex. Taking low doses of aspirin every day. Taking vitamin and mineral supplements as recommended by your health care provider. What happens during an annual well check? The services and screenings done by your health care provider during your annual well check will depend on your age, overall health, lifestyle risk factors, and family history of disease. Counseling  Your health care provider may ask you questions about your: Alcohol use. Tobacco use. Drug use. Emotional well-being. Home and relationship well-being. Sexual activity. Eating habits. History of falls. Memory and ability to understand (cognition). Work and work Astronomer. Screening  You may have the following tests or measurements: Height, weight, and BMI. Blood pressure. Lipid and cholesterol levels. These may be checked every 5 years, or more frequently if you are over 48 years old. Skin check. Lung cancer screening. You may have this screening every year starting at age 56 if you have a 30-pack-year history of smoking and currently smoke or have quit within the past 15 years. Fecal occult blood test (FOBT) of the stool. You may have this test every year starting at age 47. Flexible sigmoidoscopy or colonoscopy. You may have a sigmoidoscopy every 5 years or a colonoscopy every 10 years starting at  age 73. Prostate cancer screening. Recommendations will vary depending on your family history and other risks. Hepatitis C blood test. Hepatitis B blood test. Sexually transmitted disease (STD) testing. Diabetes screening. This is done by checking your blood sugar (glucose) after you have not eaten for a while (fasting). You may have this done every 1-3 years. Abdominal aortic aneurysm (AAA) screening. You may need this  if you are a current or former smoker. Osteoporosis. You may be screened starting at age 35 if you are at high risk. Talk with your health care provider about your test results, treatment options, and if necessary, the need for more tests. Vaccines  Your health care provider may recommend certain vaccines, such as: Influenza vaccine. This is recommended every year. Tetanus, diphtheria, and acellular pertussis (Tdap, Td) vaccine. You may need a Td booster every 10 years. Zoster vaccine. You may need this after age 31. Pneumococcal 13-valent conjugate (PCV13) vaccine. One dose is recommended after age 60. Pneumococcal polysaccharide (PPSV23) vaccine. One dose is recommended after age 57. Talk to your health care provider about which screenings and vaccines you need and how often you need them. This information is not intended to replace advice given to you by your health care provider. Make sure you discuss any questions you have with your health care provider. Document Released: 01/26/2015 Document Revised: 09/19/2015 Document Reviewed: 10/31/2014 Elsevier Interactive Patient Education  2017 [employer].  Fall Prevention in the Home Falls can cause injuries. They can happen to people of all ages. There are many things you can do to make your home safe and to help prevent falls. What can I do on the outside of my home? Regularly fix the edges of walkways and driveways and fix any cracks. Remove anything that might make you trip as you walk through a door, such as a raised step or threshold. Trim any bushes or trees on the path to your home. Use bright outdoor lighting. Clear any walking paths of anything that might make someone trip, such as rocks or tools. Regularly check to see if handrails are loose or broken. Make sure that both sides of any steps have handrails. Any raised decks and porches should have guardrails on the edges. Have any leaves, snow, or ice cleared regularly. Use sand  or salt on walking paths during winter. Clean up any spills in your garage right away. This includes oil or grease spills. What can I do in the bathroom? Use night lights. Install grab bars by the toilet and in the tub and shower. Do not use towel bars as grab bars. Use non-skid mats or decals in the tub or shower. If you need to sit down in the shower, use a plastic, non-slip stool. Keep the floor dry. Clean up any water that spills on the floor as soon as it happens. Remove soap buildup in the tub or shower regularly. Attach bath mats securely with double-sided non-slip rug tape. Do not have throw rugs and other things on the floor that can make you trip. What can I do in the bedroom? Use night lights. Make sure that you have a light by your bed that is easy to reach. Do not use any sheets or blankets that are too big for your bed. They should not hang down onto the floor. Have a firm chair that has side arms. You can use this for support while you get dressed. Do not have throw rugs and other things on the floor that can make  you trip. What can I do in the kitchen? Clean up any spills right away. Avoid walking on wet floors. Keep items that you use a lot in easy-to-reach places. If you need to reach something above you, use a strong step stool that has a grab bar. Keep electrical cords out of the way. Do not use floor polish or wax that makes floors slippery. If you must use wax, use non-skid floor wax. Do not have throw rugs and other things on the floor that can make you trip. What can I do with my stairs? Do not leave any items on the stairs. Make sure that there are handrails on both sides of the stairs and use them. Fix handrails that are broken or loose. Make sure that handrails are as long as the stairways. Check any carpeting to make sure that it is firmly attached to the stairs. Fix any carpet that is loose or worn. Avoid having throw rugs at the top or bottom of the stairs.  If you do have throw rugs, attach them to the floor with carpet tape. Make sure that you have a light switch at the top of the stairs and the bottom of the stairs. If you do not have them, ask someone to add them for you. What else can I do to help prevent falls? Wear shoes that: Do not have high heels. Have rubber bottoms. Are comfortable and fit you well. Are closed at the toe. Do not wear sandals. If you use a stepladder: Make sure that it is fully opened. Do not climb a closed stepladder. Make sure that both sides of the stepladder are locked into place. Ask someone to hold it for you, if possible. Clearly mark and make sure that you can see: Any grab bars or handrails. First and last steps. Where the edge of each step is. Use tools that help you move around (mobility aids) if they are needed. These include: Canes. Walkers. Scooters. Crutches. Turn on the lights when you go into a dark area. Replace any light bulbs as soon as they burn out. Set up your furniture so you have a clear path. Avoid moving your furniture around. If any of your floors are uneven, fix them. If there are any pets around you, be aware of where they are. Review your medicines with your doctor. Some medicines can make you feel dizzy. This can increase your chance of falling. Ask your doctor what other things that you can do to help prevent falls. This information is not intended to replace advice given to you by your health care provider. Make sure you discuss any questions you have with your health care provider. Document Released: 10/26/2008 Document Revised: 06/07/2015 Document Reviewed: 02/03/2014 Elsevier Interactive Patient Education  2017 [employer].

## 2022-07-24 ENCOUNTER — Telehealth: Payer: Self-pay | Admitting: *Deleted

## 2022-07-24 NOTE — Telephone Encounter (Signed)
Called to see where last colon cancer screening was. Pt wife Steven Oliver knew it was [facility] but didn't know location. She will check their records and call [facility] with the location so records can be requested.

## 2022-07-25 ENCOUNTER — Encounter (HOSPITAL_BASED_OUTPATIENT_CLINIC_OR_DEPARTMENT_OTHER): Payer: Self-pay | Admitting: Family Medicine

## 2022-07-25 NOTE — Telephone Encounter (Signed)
Pts wife called back Colonoscopy was  Performed at [facility] 03-24-2017 [facility]

## 2022-09-03 ENCOUNTER — Encounter (HOSPITAL_BASED_OUTPATIENT_CLINIC_OR_DEPARTMENT_OTHER): Payer: Self-pay | Admitting: Family Medicine

## 2022-10-06 ENCOUNTER — Ambulatory Visit (HOSPITAL_BASED_OUTPATIENT_CLINIC_OR_DEPARTMENT_OTHER): Payer: Medicare Other | Admitting: Family Medicine

## 2022-11-11 NOTE — Progress Notes (Signed)
"  I, Alyson Reedy, hereby attest that the medical record entry for 10/14/2022 accurately reflects signature/notations that I made  in my capacity as credentialed family nurse practitioner when I treated/diagnosed the above listed Medicare beneficiary. I do hereby attest that this information is true, accurate and complete to the best of my knowledge and I understand that any  falsification, omission, or concealment of material fact may subject me to administrative, civil, or criminal liability."

## 2022-12-04 ENCOUNTER — Encounter (HOSPITAL_BASED_OUTPATIENT_CLINIC_OR_DEPARTMENT_OTHER): Payer: Self-pay | Admitting: Family Medicine

## 2023-01-14 DEATH — deceased

## 2023-01-23 IMAGING — XA DG SPINAL PUNCT LUMBAR DIAG WITH FL CT GUIDANCE
1 series · 1 of 1 positions shown · non-contrast
Comparison: none

CLINICAL DATA: Memory loss

[Series 1: ortho standard · 1 of 1 slices shown]
[im 1/1]
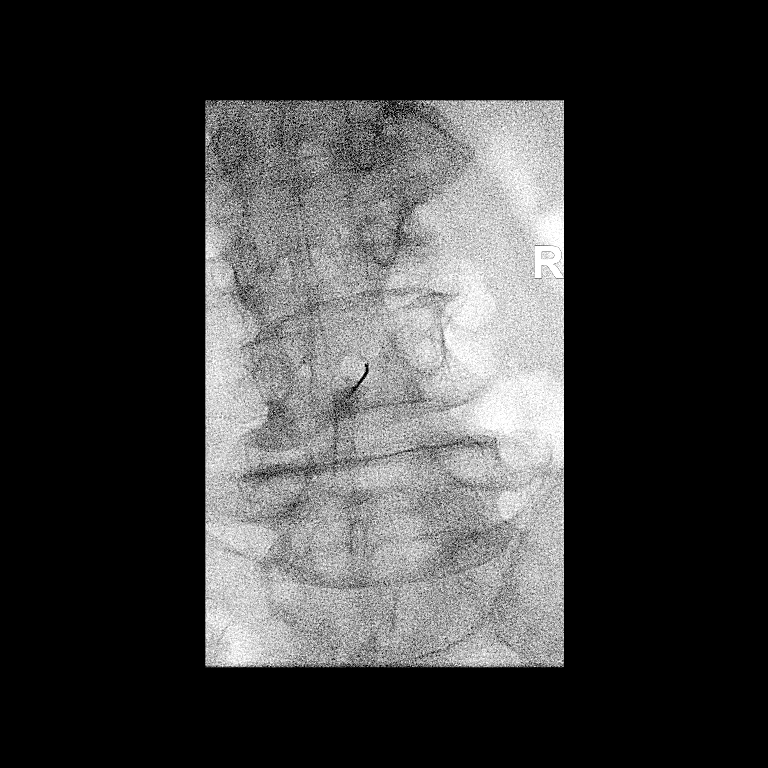

[1 of 1 positions shown; findings below may reference images not displayed]

EXAM:
DIAGNOSTIC LUMBAR PUNCTURE UNDER FLUOROSCOPIC GUIDANCE

FLUOROSCOPY TIME:  Radiation Exposure Index (as provided by the
fluoroscopic device): 0.7 mGy air Kerma

PROCEDURE:
Informed consent was obtained from the patient and spouse prior to
the procedure, including potential complications of headache,
allergy, and pain. With the patient prone, the lower back was
prepped with Betadine. 1% Lidocaine was used for local anesthesia.
Lumbar puncture was performed at the L4 level from a right
parasagittal approach using a 20 gauge needle with return of clear
colorless CSF with an opening pressure of less than 12 cm water. 27
ml of CSF were obtained for laboratory studies. The patient
tolerated the procedure well and there were no apparent
complications.
IMPRESSION: Technically successful lumbar puncture under fluoroscopy

## 2023-06-30 ENCOUNTER — Encounter (HOSPITAL_BASED_OUTPATIENT_CLINIC_OR_DEPARTMENT_OTHER): Payer: Medicare Other
